# Patient Record
Sex: Male | Born: 1950
Health system: Southern US, Community
[De-identification: ages and names within clinical notes are randomized; demographics above are authoritative.]

## PROBLEM LIST (undated history)

## (undated) DIAGNOSIS — E119 Type 2 diabetes mellitus without complications: Secondary | ICD-10-CM

## (undated) DIAGNOSIS — K760 Fatty (change of) liver, not elsewhere classified: Secondary | ICD-10-CM

## (undated) DIAGNOSIS — E039 Hypothyroidism, unspecified: Secondary | ICD-10-CM

## (undated) HISTORY — DX: Fatty (change of) liver, not elsewhere classified: K76.0

## (undated) HISTORY — PX: OTHER SURGICAL HISTORY: SHX169

## (undated) HISTORY — DX: Type 2 diabetes mellitus without complications: E11.9

## (undated) HISTORY — DX: Hypothyroidism, unspecified: E03.9

---

## 2017-02-21 DIAGNOSIS — I10 Essential (primary) hypertension: Secondary | ICD-10-CM | POA: Diagnosis not present

## 2017-02-21 DIAGNOSIS — Z6833 Body mass index (BMI) 33.0-33.9, adult: Secondary | ICD-10-CM | POA: Diagnosis not present

## 2017-02-21 DIAGNOSIS — E039 Hypothyroidism, unspecified: Secondary | ICD-10-CM | POA: Diagnosis not present

## 2017-02-21 DIAGNOSIS — K7460 Unspecified cirrhosis of liver: Secondary | ICD-10-CM | POA: Diagnosis not present

## 2017-02-21 DIAGNOSIS — Z299 Encounter for prophylactic measures, unspecified: Secondary | ICD-10-CM | POA: Diagnosis not present

## 2017-02-21 DIAGNOSIS — E1165 Type 2 diabetes mellitus with hyperglycemia: Secondary | ICD-10-CM | POA: Diagnosis not present

## 2017-03-14 DIAGNOSIS — Z125 Encounter for screening for malignant neoplasm of prostate: Secondary | ICD-10-CM | POA: Diagnosis not present

## 2017-03-14 DIAGNOSIS — K7460 Unspecified cirrhosis of liver: Secondary | ICD-10-CM | POA: Diagnosis not present

## 2017-03-14 DIAGNOSIS — Z7189 Other specified counseling: Secondary | ICD-10-CM | POA: Diagnosis not present

## 2017-03-14 DIAGNOSIS — E1165 Type 2 diabetes mellitus with hyperglycemia: Secondary | ICD-10-CM | POA: Diagnosis not present

## 2017-03-14 DIAGNOSIS — Z1389 Encounter for screening for other disorder: Secondary | ICD-10-CM | POA: Diagnosis not present

## 2017-03-14 DIAGNOSIS — Z79899 Other long term (current) drug therapy: Secondary | ICD-10-CM | POA: Diagnosis not present

## 2017-03-14 DIAGNOSIS — Z1211 Encounter for screening for malignant neoplasm of colon: Secondary | ICD-10-CM | POA: Diagnosis not present

## 2017-03-14 DIAGNOSIS — Z299 Encounter for prophylactic measures, unspecified: Secondary | ICD-10-CM | POA: Diagnosis not present

## 2017-03-14 DIAGNOSIS — R5383 Other fatigue: Secondary | ICD-10-CM | POA: Diagnosis not present

## 2017-03-14 DIAGNOSIS — I10 Essential (primary) hypertension: Secondary | ICD-10-CM | POA: Diagnosis not present

## 2017-03-14 DIAGNOSIS — Z0000 Encounter for general adult medical examination without abnormal findings: Secondary | ICD-10-CM | POA: Diagnosis not present

## 2017-03-14 DIAGNOSIS — Z6832 Body mass index (BMI) 32.0-32.9, adult: Secondary | ICD-10-CM | POA: Diagnosis not present

## 2017-03-14 DIAGNOSIS — E039 Hypothyroidism, unspecified: Secondary | ICD-10-CM | POA: Diagnosis not present

## 2017-04-27 DIAGNOSIS — H25813 Combined forms of age-related cataract, bilateral: Secondary | ICD-10-CM | POA: Diagnosis not present

## 2017-04-27 DIAGNOSIS — H52223 Regular astigmatism, bilateral: Secondary | ICD-10-CM | POA: Diagnosis not present

## 2017-04-27 DIAGNOSIS — E119 Type 2 diabetes mellitus without complications: Secondary | ICD-10-CM | POA: Diagnosis not present

## 2017-04-27 DIAGNOSIS — H524 Presbyopia: Secondary | ICD-10-CM | POA: Diagnosis not present

## 2017-06-17 DIAGNOSIS — Z299 Encounter for prophylactic measures, unspecified: Secondary | ICD-10-CM | POA: Diagnosis not present

## 2017-06-17 DIAGNOSIS — E1165 Type 2 diabetes mellitus with hyperglycemia: Secondary | ICD-10-CM | POA: Diagnosis not present

## 2017-06-17 DIAGNOSIS — Z6832 Body mass index (BMI) 32.0-32.9, adult: Secondary | ICD-10-CM | POA: Diagnosis not present

## 2017-06-17 DIAGNOSIS — K7460 Unspecified cirrhosis of liver: Secondary | ICD-10-CM | POA: Diagnosis not present

## 2017-06-17 DIAGNOSIS — I10 Essential (primary) hypertension: Secondary | ICD-10-CM | POA: Diagnosis not present

## 2017-06-17 DIAGNOSIS — E039 Hypothyroidism, unspecified: Secondary | ICD-10-CM | POA: Diagnosis not present

## 2017-09-29 DIAGNOSIS — S20212A Contusion of left front wall of thorax, initial encounter: Secondary | ICD-10-CM | POA: Diagnosis not present

## 2017-09-29 DIAGNOSIS — M25522 Pain in left elbow: Secondary | ICD-10-CM | POA: Diagnosis not present

## 2017-09-29 DIAGNOSIS — W010XXA Fall on same level from slipping, tripping and stumbling without subsequent striking against object, initial encounter: Secondary | ICD-10-CM | POA: Diagnosis not present

## 2017-09-29 DIAGNOSIS — R079 Chest pain, unspecified: Secondary | ICD-10-CM | POA: Diagnosis not present

## 2017-09-29 DIAGNOSIS — Z87891 Personal history of nicotine dependence: Secondary | ICD-10-CM | POA: Diagnosis not present

## 2017-09-29 DIAGNOSIS — S299XXA Unspecified injury of thorax, initial encounter: Secondary | ICD-10-CM | POA: Diagnosis not present

## 2017-09-29 DIAGNOSIS — E119 Type 2 diabetes mellitus without complications: Secondary | ICD-10-CM | POA: Diagnosis not present

## 2017-09-29 DIAGNOSIS — S52125A Nondisplaced fracture of head of left radius, initial encounter for closed fracture: Secondary | ICD-10-CM | POA: Diagnosis not present

## 2017-10-03 DIAGNOSIS — Z8379 Family history of other diseases of the digestive system: Secondary | ICD-10-CM | POA: Diagnosis not present

## 2017-10-03 DIAGNOSIS — E039 Hypothyroidism, unspecified: Secondary | ICD-10-CM | POA: Diagnosis not present

## 2017-10-03 DIAGNOSIS — K7460 Unspecified cirrhosis of liver: Secondary | ICD-10-CM | POA: Diagnosis present

## 2017-10-03 DIAGNOSIS — E119 Type 2 diabetes mellitus without complications: Secondary | ICD-10-CM | POA: Diagnosis not present

## 2017-10-03 DIAGNOSIS — T50905A Adverse effect of unspecified drugs, medicaments and biological substances, initial encounter: Secondary | ICD-10-CM | POA: Diagnosis not present

## 2017-10-03 DIAGNOSIS — R410 Disorientation, unspecified: Secondary | ICD-10-CM | POA: Diagnosis not present

## 2017-10-03 DIAGNOSIS — Z87891 Personal history of nicotine dependence: Secondary | ICD-10-CM | POA: Diagnosis not present

## 2017-10-03 DIAGNOSIS — Z8249 Family history of ischemic heart disease and other diseases of the circulatory system: Secondary | ICD-10-CM | POA: Diagnosis not present

## 2017-10-03 DIAGNOSIS — Z7984 Long term (current) use of oral hypoglycemic drugs: Secondary | ICD-10-CM | POA: Diagnosis not present

## 2017-10-03 DIAGNOSIS — K7290 Hepatic failure, unspecified without coma: Secondary | ICD-10-CM | POA: Diagnosis present

## 2017-10-03 DIAGNOSIS — I10 Essential (primary) hypertension: Secondary | ICD-10-CM | POA: Diagnosis present

## 2017-10-03 DIAGNOSIS — Z888 Allergy status to other drugs, medicaments and biological substances status: Secondary | ICD-10-CM | POA: Diagnosis not present

## 2017-10-03 DIAGNOSIS — S42402D Unspecified fracture of lower end of left humerus, subsequent encounter for fracture with routine healing: Secondary | ICD-10-CM | POA: Diagnosis not present

## 2017-10-03 DIAGNOSIS — Z79899 Other long term (current) drug therapy: Secondary | ICD-10-CM | POA: Diagnosis not present

## 2017-10-03 DIAGNOSIS — R4182 Altered mental status, unspecified: Secondary | ICD-10-CM | POA: Diagnosis not present

## 2017-10-06 ENCOUNTER — Encounter (INDEPENDENT_AMBULATORY_CARE_PROVIDER_SITE_OTHER): Payer: Self-pay

## 2017-10-10 ENCOUNTER — Encounter (INDEPENDENT_AMBULATORY_CARE_PROVIDER_SITE_OTHER): Payer: Self-pay | Admitting: *Deleted

## 2017-10-10 ENCOUNTER — Encounter (INDEPENDENT_AMBULATORY_CARE_PROVIDER_SITE_OTHER): Payer: Self-pay | Admitting: Internal Medicine

## 2017-10-10 ENCOUNTER — Ambulatory Visit (INDEPENDENT_AMBULATORY_CARE_PROVIDER_SITE_OTHER): Payer: Medicare Other | Admitting: Internal Medicine

## 2017-10-10 DIAGNOSIS — E039 Hypothyroidism, unspecified: Secondary | ICD-10-CM | POA: Diagnosis not present

## 2017-10-10 DIAGNOSIS — E119 Type 2 diabetes mellitus without complications: Secondary | ICD-10-CM | POA: Insufficient documentation

## 2017-10-10 DIAGNOSIS — K760 Fatty (change of) liver, not elsewhere classified: Secondary | ICD-10-CM | POA: Diagnosis not present

## 2017-10-10 MED ORDER — RIFAXIMIN 550 MG PO TABS: 550.0000 mg | ORAL_TABLET | Freq: Two times a day (BID) | ORAL | 3 refills | Status: DC

## 2017-10-10 NOTE — Patient Instructions (Addendum)
Diet and exercise. Pick Rx for Xifaxin. OV in 3 months.  Low fat diet.  Hold the Lactulose for night till I get the [place] report.  Fatty Liver Fatty liver, also called hepatic steatosis or steatohepatitis, is a condition in which too much fat has built up in your liver cells. The liver removes harmful substances from your bloodstream. It produces fluids your body needs. It also helps your body use and store energy from the food you eat. In many cases, fatty liver does not cause symptoms or problems. It is often diagnosed when tests are being done for other reasons. However, over time, fatty liver can cause inflammation that may lead to more serious liver problems, such as scarring of the liver (cirrhosis). What are the causes? Causes of fatty liver may include:  Drinking too much alcohol.  Poor nutrition.  Obesity.  Cushing syndrome.  Diabetes.  Hyperlipidemia.  Pregnancy.  Certain drugs.  Poisons.  Some viral infections.  What increases the risk? You may be more likely to develop fatty liver if you:  Abuse alcohol.  Are pregnant.  Are overweight.  Have diabetes.  Have hepatitis.  Have a high triglyceride level.  What are the signs or symptoms? Fatty liver often does not cause any symptoms. In cases where symptoms develop, they can include:  Fatigue.  Weakness.  Weight loss.  Confusion.  Abdominal pain.  Yellowing of your skin and the white parts of your eyes (jaundice).  Nausea and vomiting.  How is this diagnosed? Fatty liver may be diagnosed by:  Physical exam and medical history.  Blood tests.  Imaging tests, such as an ultrasound, CT scan, or MRI.  Liver biopsy. A small sample of liver tissue is removed using a needle. The sample is then looked at under a microscope.  How is this treated? Fatty liver is often caused by other health conditions. Treatment for fatty liver may involve medicines and lifestyle changes to manage conditions such  as:  Alcoholism.  High cholesterol.  Diabetes.  Being overweight or obese.  Follow these instructions at home:  Eat a healthy diet as directed by your health care provider.  Exercise regularly. This can help you lose weight and control your cholesterol and diabetes. Talk to your health care provider about an exercise plan and which activities are best for you.  Do not drink alcohol.  Take medicines only as directed by your health care provider. Contact a health care provider if: You have difficulty controlling your:  Blood sugar.  Cholesterol.  Alcohol consumption.  Get help right away if:  You have abdominal pain.  You have jaundice.  You have nausea and vomiting. This information is not intended to replace advice given to you by your health care provider. Make sure you discuss any questions you have with your health care provider. Document Released: 10/22/2005 Document Revised: 02/12/2016 Document Reviewed: 01/16/2014 Elsevier Interactive Patient Education  [employer].

## 2017-10-10 NOTE — Progress Notes (Addendum)
Subjective:    Patient ID: Jeremy Chavez, male    DOB: December 13, 1950, 67 y.o.   MRN: [id number]  HPI Referred by Dr. Woody Seller for hepatic encephalopathy/cirrhosis. Admitted.  [place] for mental status changes in January of this year.  Noted to have ammonia of 108. Discharge diagnosis of hepatic encephalopathy  due to cirrhoisis of the liver. Admitted x 3 days. Wife is in room.   Hx of diabetes, cirrhosis, hypothyroidism, hypertension, aortic valve sclerosis.  Wife states patient was seen in [place]. Was told he had cirrhosis at that time.  Family hx of fatty liver in a sister who is now deceased at age 48 Appetite is good. He usually has a BM x 1 a day before the Lactulose. No melena or BRRB. Evaluated at [facility] in [facility] and was told he had cirrhosis in 2015     10/07/2014 EGD/colonoscopy: The duodenum revealed some duodenitis with erythematous patches. Stomach itself revealed antral gastritis with erosions. Biopsies were taken and will be checked for Helicobacter. There was mild reflux esophagitis at the EG junction. Again, biopsies were taken here and will be checked for Barrett's. He had some minimal dilatation of some esophageal veins. Pediatric colonoscope was introduced into the rectum and manipulated up to the cecum. Bowel prep was not good. He had diverticulosis of the left colon. He had some prominent erythematous blushy type vessels in the cecum.   02/26/2015 US abdomen: Splenomegaly. Minimally complex left renal cyst.        10/03/2016 Chest xray. One week of acute mental status changes.  No acute cardiopulmonary disease. Stable mild changes of bronchitis and/or asthma which may be acute or chronic.  10/03/2017 CT  HEAD: No acute intracranial pathology. 10/04/2017 ammonia 157.2 10/03/2017 Plasma ammonia found to be elevated at 108.  10/03/2016 ammonia 62. 10/03/2017 Acute hepatitis panel was negative. 10/03/2017 H and H 13 and 33.3. Platelet ct 64.  ALP 80, AST 36.3,  total bili 1.8, ALT 22., Lipase 93.  10/03/2017:Urinalysis showed 0-5 WBCs, Drug screen negative.   Review of Systems Past Medical History:  Diagnosis Date  . Diabetes ([place])   . Hypothyroid   . NAFLD (nonalcoholic fatty liver disease)       No Known Allergies  Current Outpatient Medications on File Prior to Visit  Medication Sig Dispense Refill  . Ferrous Gluconate (IRON 27 PO) Take 65 mg by mouth.    . lactulose (CHRONULAC) 10 GM/15ML solution Take by mouth 3 (three) times daily.    Marland Kitchen levothyroxine (SYNTHROID, LEVOTHROID) 125 MCG tablet Take 125 mcg by mouth daily before breakfast.    . lisinopril-hydrochlorothiazide (PRINZIDE,ZESTORETIC) 20-25 MG tablet Take 1 tablet by mouth daily.    . meloxicam (MOBIC) 15 MG tablet Take 15 mg by mouth daily.     No current facility-administered medications on file prior to visit.         Objective:   Physical Exam Blood pressure (!) 146/72, pulse 72, temperature 98 F (36.7 C), height 6' 1"  (1.854 m), weight 260 lb 9.6 oz (118.2 kg). Alert and oriented. Skin warm and dry. Oral mucosa is moist.   . Sclera anicteric, conjunctivae is pink. Thyroid not enlarged. No cervical lymphadenopathy. Lungs clear. Heart regular rate and rhythm.  Abdomen is soft. Bowel sounds are positive. No hepatomegaly. No abdominal masses felt. No tenderness.  No edema to lower extremities.          Assessment & Plan:  Probably NAFLD.Recent hx of hepatic encephalopathy. Will get  US abdomen and Labs. (Insurance would not pay for [employer]) Rx for Xifaxan sent to his pharmacy. Not to start the xifaxan till I get [place] report.  Thrombocytopenia probably related to his NAFLD.  Family hx of NAFLD in a sister who died at age 72.  OV in 3 months.

## 2017-10-11 LAB — HEPATIC FUNCTION PANEL
AG RATIO: 1.2 (calc) (ref 1.0–2.5)
ALBUMIN MSPROF: 3.7 g/dL (ref 3.6–5.1)
ALT: 26 U/L (ref 9–46)
AST: 38 U/L — ABNORMAL HIGH (ref 10–35)
Alkaline phosphatase (APISO): 88 U/L (ref 40–115)
Bilirubin, Direct: 0.4 mg/dL — ABNORMAL HIGH (ref 0.0–0.2)
GLOBULIN: 3 g/dL (ref 1.9–3.7)
Indirect Bilirubin: 1.4 mg/dL (calc) — ABNORMAL HIGH (ref 0.2–1.2)
TOTAL PROTEIN: 6.7 g/dL (ref 6.1–8.1)
Total Bilirubin: 1.8 mg/dL — ABNORMAL HIGH (ref 0.2–1.2)

## 2017-10-11 LAB — ALPHA-1-ANTITRYPSIN: A-1 Antitrypsin, Ser: 170 mg/dL (ref 83–199)

## 2017-10-11 LAB — PROTIME-INR
INR: 1.1
PROTHROMBIN TIME: 11.2 s (ref 9.0–11.5)

## 2017-10-11 LAB — AMMONIA: AMMONIA: 128 umol/L — AB (ref <=72)

## 2017-10-11 LAB — SEDIMENTATION RATE: SED RATE: 11 mm/h (ref 0–20)

## 2017-10-11 LAB — CERULOPLASMIN: Ceruloplasmin: 27 mg/dL (ref 18–36)

## 2017-10-12 ENCOUNTER — Other Ambulatory Visit (INDEPENDENT_AMBULATORY_CARE_PROVIDER_SITE_OTHER): Payer: Self-pay | Admitting: *Deleted

## 2017-10-12 ENCOUNTER — Encounter (INDEPENDENT_AMBULATORY_CARE_PROVIDER_SITE_OTHER): Payer: Self-pay | Admitting: *Deleted

## 2017-10-12 DIAGNOSIS — I10 Essential (primary) hypertension: Secondary | ICD-10-CM | POA: Diagnosis not present

## 2017-10-12 DIAGNOSIS — Z299 Encounter for prophylactic measures, unspecified: Secondary | ICD-10-CM | POA: Diagnosis not present

## 2017-10-12 DIAGNOSIS — E1165 Type 2 diabetes mellitus with hyperglycemia: Secondary | ICD-10-CM | POA: Diagnosis not present

## 2017-10-12 DIAGNOSIS — Z87891 Personal history of nicotine dependence: Secondary | ICD-10-CM | POA: Diagnosis not present

## 2017-10-12 DIAGNOSIS — E039 Hypothyroidism, unspecified: Secondary | ICD-10-CM | POA: Diagnosis not present

## 2017-10-12 DIAGNOSIS — Z6833 Body mass index (BMI) 33.0-33.9, adult: Secondary | ICD-10-CM | POA: Diagnosis not present

## 2017-10-12 DIAGNOSIS — Z09 Encounter for follow-up examination after completed treatment for conditions other than malignant neoplasm: Secondary | ICD-10-CM | POA: Diagnosis not present

## 2017-10-12 DIAGNOSIS — K760 Fatty (change of) liver, not elsewhere classified: Secondary | ICD-10-CM

## 2017-10-12 DIAGNOSIS — K7290 Hepatic failure, unspecified without coma: Secondary | ICD-10-CM | POA: Diagnosis not present

## 2017-10-12 DIAGNOSIS — K7460 Unspecified cirrhosis of liver: Secondary | ICD-10-CM | POA: Diagnosis not present

## 2017-10-14 ENCOUNTER — Ambulatory Visit (HOSPITAL_COMMUNITY)
Admission: RE | Admit: 2017-10-14 | Discharge: 2017-10-14 | Disposition: A | Discharge status: Home / Self Care | Payer: Medicare Other | Source: Ambulatory Visit | Attending: Internal Medicine | Admitting: Internal Medicine

## 2017-10-14 DIAGNOSIS — K829 Disease of gallbladder, unspecified: Secondary | ICD-10-CM | POA: Diagnosis not present

## 2017-10-14 DIAGNOSIS — Q619 Cystic kidney disease, unspecified: Secondary | ICD-10-CM | POA: Insufficient documentation

## 2017-10-14 DIAGNOSIS — R161 Splenomegaly, not elsewhere classified: Secondary | ICD-10-CM | POA: Diagnosis not present

## 2017-10-14 DIAGNOSIS — K760 Fatty (change of) liver, not elsewhere classified: Secondary | ICD-10-CM | POA: Diagnosis not present

## 2017-10-18 ENCOUNTER — Telehealth (INDEPENDENT_AMBULATORY_CARE_PROVIDER_SITE_OTHER): Payer: Self-pay | Admitting: *Deleted

## 2017-10-18 NOTE — Telephone Encounter (Signed)
Aunya called from [place] my meds to check on the piror authorization for xisaxan. Please advise [phone] ref # [id number]

## 2017-10-18 NOTE — Telephone Encounter (Signed)
I made Jeremy Chavez aware

## 2017-10-18 NOTE — Telephone Encounter (Signed)
Let her know the Xifaxan is on hold right now till I get his [place]

## 2017-10-19 ENCOUNTER — Telehealth (INDEPENDENT_AMBULATORY_CARE_PROVIDER_SITE_OTHER): Payer: Self-pay | Admitting: Internal Medicine

## 2017-10-19 DIAGNOSIS — K760 Fatty (change of) liver, not elsewhere classified: Secondary | ICD-10-CM | POA: Diagnosis not present

## 2017-10-19 DIAGNOSIS — N2889 Other specified disorders of kidney and ureter: Secondary | ICD-10-CM

## 2017-10-19 NOTE — Telephone Encounter (Signed)
MRI sch'd 10/28/17, patient aware

## 2017-10-19 NOTE — Telephone Encounter (Signed)
MRI ordered

## 2017-10-20 LAB — AMMONIA: AMMONIA: 95 umol/L — AB (ref <=72)

## 2017-10-28 ENCOUNTER — Ambulatory Visit (HOSPITAL_COMMUNITY)
Admission: RE | Admit: 2017-10-28 | Discharge: 2017-10-28 | Disposition: A | Discharge status: Home / Self Care | Payer: Medicare Other | Source: Ambulatory Visit | Attending: Internal Medicine | Admitting: Internal Medicine

## 2017-10-28 DIAGNOSIS — I864 Gastric varices: Secondary | ICD-10-CM | POA: Diagnosis not present

## 2017-10-28 DIAGNOSIS — N281 Cyst of kidney, acquired: Secondary | ICD-10-CM | POA: Insufficient documentation

## 2017-10-28 DIAGNOSIS — N2889 Other specified disorders of kidney and ureter: Secondary | ICD-10-CM

## 2017-10-28 DIAGNOSIS — K760 Fatty (change of) liver, not elsewhere classified: Secondary | ICD-10-CM | POA: Diagnosis not present

## 2017-10-28 DIAGNOSIS — I8500 Esophageal varices without bleeding: Secondary | ICD-10-CM | POA: Insufficient documentation

## 2017-10-28 DIAGNOSIS — K7460 Unspecified cirrhosis of liver: Secondary | ICD-10-CM | POA: Diagnosis not present

## 2017-10-28 DIAGNOSIS — R161 Splenomegaly, not elsewhere classified: Secondary | ICD-10-CM | POA: Insufficient documentation

## 2017-10-28 LAB — POCT I-STAT CREATININE: CREATININE: 0.9 mg/dL (ref 0.61–1.24)

## 2017-10-28 MED ORDER — GADOBENATE DIMEGLUMINE 529 MG/ML IV SOLN
20.0000 mL | Freq: Once | INTRAVENOUS | Status: AC | PRN
  Administered 2017-10-28: 20 mL via INTRAVENOUS

## 2017-10-31 ENCOUNTER — Telehealth (INDEPENDENT_AMBULATORY_CARE_PROVIDER_SITE_OTHER): Payer: Self-pay | Admitting: *Deleted

## 2017-10-31 NOTE — Telephone Encounter (Signed)
Results of MRI given to wife.

## 2017-10-31 NOTE — Telephone Encounter (Signed)
Patient's wife left message returning Terri's call. [phone]

## 2017-11-11 DIAGNOSIS — E1165 Type 2 diabetes mellitus with hyperglycemia: Secondary | ICD-10-CM | POA: Diagnosis not present

## 2017-11-11 DIAGNOSIS — K7290 Hepatic failure, unspecified without coma: Secondary | ICD-10-CM | POA: Diagnosis not present

## 2017-11-11 DIAGNOSIS — Z6831 Body mass index (BMI) 31.0-31.9, adult: Secondary | ICD-10-CM | POA: Diagnosis not present

## 2017-11-11 DIAGNOSIS — K7460 Unspecified cirrhosis of liver: Secondary | ICD-10-CM | POA: Diagnosis not present

## 2017-11-11 DIAGNOSIS — Z299 Encounter for prophylactic measures, unspecified: Secondary | ICD-10-CM | POA: Diagnosis not present

## 2017-11-17 ENCOUNTER — Telehealth (INDEPENDENT_AMBULATORY_CARE_PROVIDER_SITE_OTHER): Payer: Self-pay | Admitting: Internal Medicine

## 2017-11-17 NOTE — Telephone Encounter (Signed)
He is aware of his ammonia. He had ran out of his Lactulose. Did not get Xifaxan filled. The co pay was too high. Has restarted the lactulose 30cc TID.     Jeremy Chavez recheck ammonia in 4 weeks.

## 2017-11-18 ENCOUNTER — Other Ambulatory Visit (INDEPENDENT_AMBULATORY_CARE_PROVIDER_SITE_OTHER): Payer: Self-pay | Admitting: *Deleted

## 2017-11-18 ENCOUNTER — Encounter (INDEPENDENT_AMBULATORY_CARE_PROVIDER_SITE_OTHER): Payer: Self-pay | Admitting: *Deleted

## 2017-11-18 DIAGNOSIS — K760 Fatty (change of) liver, not elsewhere classified: Secondary | ICD-10-CM

## 2017-11-18 NOTE — Telephone Encounter (Signed)
Lab noted for 3 weeks and a letter has been sent to the patient.

## 2017-12-16 DIAGNOSIS — K760 Fatty (change of) liver, not elsewhere classified: Secondary | ICD-10-CM | POA: Diagnosis not present

## 2017-12-17 LAB — AMMONIA: Ammonia: 102 umol/L — ABNORMAL HIGH (ref <=72)

## 2017-12-20 ENCOUNTER — Other Ambulatory Visit (INDEPENDENT_AMBULATORY_CARE_PROVIDER_SITE_OTHER): Payer: Self-pay | Admitting: *Deleted

## 2017-12-20 ENCOUNTER — Encounter (INDEPENDENT_AMBULATORY_CARE_PROVIDER_SITE_OTHER): Payer: Self-pay | Admitting: *Deleted

## 2017-12-20 DIAGNOSIS — K760 Fatty (change of) liver, not elsewhere classified: Secondary | ICD-10-CM

## 2017-12-27 ENCOUNTER — Ambulatory Visit (INDEPENDENT_AMBULATORY_CARE_PROVIDER_SITE_OTHER): Payer: Medicare Other | Admitting: Urology

## 2017-12-27 DIAGNOSIS — N281 Cyst of kidney, acquired: Secondary | ICD-10-CM | POA: Diagnosis not present

## 2017-12-27 DIAGNOSIS — N5201 Erectile dysfunction due to arterial insufficiency: Secondary | ICD-10-CM

## 2018-01-03 DIAGNOSIS — K760 Fatty (change of) liver, not elsewhere classified: Secondary | ICD-10-CM | POA: Diagnosis not present

## 2018-01-04 ENCOUNTER — Telehealth (INDEPENDENT_AMBULATORY_CARE_PROVIDER_SITE_OTHER): Payer: Self-pay | Admitting: Internal Medicine

## 2018-01-04 LAB — AMMONIA: AMMONIA: 116 umol/L — AB (ref <=72)

## 2018-01-04 NOTE — Telephone Encounter (Signed)
Results given to patient.  Will repeat ammonia in 2 weeks.  Denies any confusion. Having 2-3 stools a day and somedays he has one stool. Taking 3 times a day.   Tammy, Ammonia in 2 weeks.

## 2018-01-05 ENCOUNTER — Other Ambulatory Visit (INDEPENDENT_AMBULATORY_CARE_PROVIDER_SITE_OTHER): Payer: Self-pay | Admitting: *Deleted

## 2018-01-05 DIAGNOSIS — K760 Fatty (change of) liver, not elsewhere classified: Secondary | ICD-10-CM

## 2018-01-05 NOTE — Telephone Encounter (Signed)
The ammonia level has been ordered.

## 2018-01-09 ENCOUNTER — Ambulatory Visit (INDEPENDENT_AMBULATORY_CARE_PROVIDER_SITE_OTHER): Payer: PRIVATE HEALTH INSURANCE | Admitting: Internal Medicine

## 2018-01-11 ENCOUNTER — Encounter (INDEPENDENT_AMBULATORY_CARE_PROVIDER_SITE_OTHER): Payer: Self-pay | Admitting: *Deleted

## 2018-01-11 ENCOUNTER — Encounter (INDEPENDENT_AMBULATORY_CARE_PROVIDER_SITE_OTHER): Payer: Self-pay | Admitting: Internal Medicine

## 2018-01-11 ENCOUNTER — Ambulatory Visit (INDEPENDENT_AMBULATORY_CARE_PROVIDER_SITE_OTHER): Payer: Medicare Other | Admitting: Internal Medicine

## 2018-01-11 VITALS — BP 128/58 | HR 60 | Temp 97.8°F | Ht 73.0 in | Wt 240.3 lb

## 2018-01-11 DIAGNOSIS — K7460 Unspecified cirrhosis of liver: Secondary | ICD-10-CM | POA: Diagnosis not present

## 2018-01-11 DIAGNOSIS — D696 Thrombocytopenia, unspecified: Secondary | ICD-10-CM

## 2018-01-11 LAB — SEDIMENTATION RATE: SED RATE: 6 mm/h (ref 0–20)

## 2018-01-11 NOTE — Progress Notes (Signed)
Subjective:    Patient ID: Jeremy Chavez, male    DOB: Jun 08, 1951, 67 y.o.   MRN: [id number]  HPI Here today for f/o. Last seen in January of this year. Hx of NAFLD/cirrhosis. Admitted to [facility] in January of this for mental status changes. Noted to have ammonia 108.  Admitted x 3 days.  10/04/2017 Ammonia 157. He tells me he is doing good. He good not afford the Xifaxan. He has lost about 20 pounds since January which was intentional. He has been eating more healthier.  He has a BM x 2-3 a day. Stools are soft.  No melena or BRRB.  Blood sugars 104-125 HA1C 2 months ago 5.3.    Hx of diabetes, cirrhosis, hypothyroidism, hypertension Evaluated at [facility] in [facility] was was told he had cirrhosis in 2015 Family hx of fatty liver in a sister who is now deceased at age 27.    4/162019 Ammonia 118 10/10/2017 Ceruloplasmin, Alpha 1 antitrypsin normal. INR/PT 1.1/11.2. Acute hepatitis panel negative.   10/05/2017 H and H 10.5 and 30.4, Platelet ct 61.         10/28/2017 MR Abdomen/w/o CM:   . IMPRESSION: Bilateral renal cysts, including a 4.0 cm bilobed hemorrhagic cyst arising from the posterior left upper kidney, benign (Bosniak II). Two additional hemorrhagic cysts in the interpolar and left lower kidney measuring up to 1.4 cm, benign (Bosniak II).  No enhancing renal lesions.  Cirrhosis.  No suspicious hepatic lesions.  Splenomegaly.  Gastroesophageal varices.  Splenorenal shunt.  Apparent partial filling defect in the main portal vein, worrisome for subocclusive portal vein thrombosis.   10/14/2017 US abdomen;  Liver: Mild increased echogenicity is noted consistent with the given clinical history of fatty infiltration. Portal vein is patent on color Doppler imaging with normal direction of blood flow towards the liver.  IMPRESSION: Mild splenomegaly and suggestion of splenic varices.  Gallbladder wall thickening without  cholelithiasis.     10/07/2014 EGD/colonoscopy: guaiac positive stool, anemia, heartburn.  The duodenum revealed some duodenitis with erythematous patches. Stomach itself revealed antral gastritis with erosions. Biopsies were taken and will be checked for Helicobacter. There was mild reflux esophagitis at the EG junction. Again, biopsies were taken here and will be checked for Barrett's. He had some minimal dilatation of some esophageal veins. Pediatric colonoscope was introduced into the rectum and manipulated up to the cecum. Bowel prep was not good. He had diverticulosis of the left colon. He had some prominent erythematous blushy type vessels in the cecum.   02/26/2015 US abdomen: Splenomegaly. Minimally complex left renal cyst.     Review of Systems Past Medical History:  Diagnosis Date  . Diabetes ([place])   . Hypothyroid   . NAFLD (nonalcoholic fatty liver disease)     Past Surgical History:  Procedure Laterality Date  . carpal tunnel rt hand    . umblical hernia repair      No Known Allergies  Current Outpatient Medications on File Prior to Visit  Medication Sig Dispense Refill  . Ferrous Gluconate (IRON 27 PO) Take 325 mg by mouth 2 (two) times daily before a meal.     . lactulose (CHRONULAC) 10 GM/15ML solution Take by mouth 3 (three) times daily.    Marland Kitchen levothyroxine (SYNTHROID, LEVOTHROID) 125 MCG tablet Take 125 mcg by mouth daily before breakfast.    . lisinopril-hydrochlorothiazide (PRINZIDE,ZESTORETIC) 20-25 MG tablet Take 1 tablet by mouth daily.     No current facility-administered medications on file prior to  visit.         Objective:   Physical Exam Blood pressure (!) 128/58, pulse 60, temperature 97.8 F (36.6 C), height 6\' 1"  (1.854 m), weight 240 lb 4.8 oz (109 kg). Alert and oriented. Skin warm and dry. Oral mucosa is moist.   . Sclera anicteric, conjunctivae is pink. Thyroid not enlarged. No cervical lymphadenopathy. Lungs clear. Heart regular rate and  rhythm.  Abdomen is soft. Bowel sounds are positive. No hepatomegaly. No abdominal masses felt. No tenderness. 1+ edema to lower extremities.           Assessment & Plan:  NAFLD/Cirrhosis: Will get SMA, ANA, IGG, sedrate, CBC, Hepatic  Esophageal Varices needs to be ruled out. EGD with possible banding. Thrombocytopenia probably related to his NAFLD/cirrhosis.  OV in 6 months.

## 2018-01-11 NOTE — Patient Instructions (Addendum)
Labs. EGD/with possible banding. The risks of bleeding, perforation and infection were reviewed with patient.

## 2018-01-12 ENCOUNTER — Encounter (INDEPENDENT_AMBULATORY_CARE_PROVIDER_SITE_OTHER): Payer: Self-pay | Admitting: *Deleted

## 2018-01-12 ENCOUNTER — Other Ambulatory Visit (INDEPENDENT_AMBULATORY_CARE_PROVIDER_SITE_OTHER): Payer: Self-pay | Admitting: *Deleted

## 2018-01-12 DIAGNOSIS — K760 Fatty (change of) liver, not elsewhere classified: Secondary | ICD-10-CM

## 2018-01-12 LAB — CBC WITH DIFFERENTIAL/PLATELET
BASOS PCT: 0.6 %
Basophils Absolute: 19 cells/uL (ref 0–200)
Eosinophils Absolute: 61 cells/uL (ref 15–500)
Eosinophils Relative: 1.9 %
HEMATOCRIT: 29.5 % — AB (ref 38.5–50.0)
HEMOGLOBIN: 10.4 g/dL — AB (ref 13.2–17.1)
LYMPHS ABS: 880 {cells}/uL (ref 850–3900)
MCH: 31.5 pg (ref 27.0–33.0)
MCHC: 35.3 g/dL (ref 32.0–36.0)
MCV: 89.4 fL (ref 80.0–100.0)
MPV: 11.7 fL (ref 7.5–12.5)
Monocytes Relative: 10.4 %
Neutro Abs: 1907 cells/uL (ref 1500–7800)
Neutrophils Relative %: 59.6 %
Platelets: 50 10*3/uL — ABNORMAL LOW (ref 140–400)
RBC: 3.3 10*6/uL — AB (ref 4.20–5.80)
RDW: 13.2 % (ref 11.0–15.0)
TOTAL LYMPHOCYTE: 27.5 %
WBC: 3.2 10*3/uL — ABNORMAL LOW (ref 3.8–10.8)
WBCMIX: 333 {cells}/uL (ref 200–950)

## 2018-01-12 LAB — HEPATIC FUNCTION PANEL
AG RATIO: 1.5 (calc) (ref 1.0–2.5)
ALBUMIN MSPROF: 3.8 g/dL (ref 3.6–5.1)
ALT: 19 U/L (ref 9–46)
AST: 33 U/L (ref 10–35)
Alkaline phosphatase (APISO): 61 U/L (ref 40–115)
BILIRUBIN INDIRECT: 2.2 mg/dL — AB (ref 0.2–1.2)
Bilirubin, Direct: 0.6 mg/dL — ABNORMAL HIGH (ref 0.0–0.2)
GLOBULIN: 2.5 g/dL (ref 1.9–3.7)
TOTAL PROTEIN: 6.3 g/dL (ref 6.1–8.1)
Total Bilirubin: 2.8 mg/dL — ABNORMAL HIGH (ref 0.2–1.2)

## 2018-01-12 LAB — IGG: IgG (Immunoglobin G), Serum: 1277 mg/dL (ref 694–1618)

## 2018-01-13 LAB — ANA: Anti Nuclear Antibody(ANA): NEGATIVE

## 2018-01-16 ENCOUNTER — Encounter (INDEPENDENT_AMBULATORY_CARE_PROVIDER_SITE_OTHER): Payer: Self-pay | Admitting: *Deleted

## 2018-01-16 ENCOUNTER — Other Ambulatory Visit (INDEPENDENT_AMBULATORY_CARE_PROVIDER_SITE_OTHER): Payer: Self-pay | Admitting: *Deleted

## 2018-01-16 DIAGNOSIS — K7460 Unspecified cirrhosis of liver: Secondary | ICD-10-CM

## 2018-01-17 LAB — ANTI-SMOOTH MUSCLE ANTIBODY, IGG: Actin (Smooth Muscle) Antibody (IGG): <20 U (ref <20)

## 2018-01-20 DIAGNOSIS — K7460 Unspecified cirrhosis of liver: Secondary | ICD-10-CM | POA: Diagnosis not present

## 2018-01-20 DIAGNOSIS — K760 Fatty (change of) liver, not elsewhere classified: Secondary | ICD-10-CM | POA: Diagnosis not present

## 2018-01-20 LAB — CBC
HCT: 28.9 % — ABNORMAL LOW (ref 38.5–50.0)
HEMOGLOBIN: 10.2 g/dL — AB (ref 13.2–17.1)
MCH: 31.5 pg (ref 27.0–33.0)
MCHC: 35.3 g/dL (ref 32.0–36.0)
MCV: 89.2 fL (ref 80.0–100.0)
MPV: 12.5 fL (ref 7.5–12.5)
Platelets: 52 10*3/uL — ABNORMAL LOW (ref 140–400)
RBC: 3.24 10*6/uL — ABNORMAL LOW (ref 4.20–5.80)
RDW: 13.2 % (ref 11.0–15.0)
WBC: 2.8 10*3/uL — ABNORMAL LOW (ref 3.8–10.8)

## 2018-01-20 LAB — HEPATIC FUNCTION PANEL
AG Ratio: 1.4 (calc) (ref 1.0–2.5)
ALBUMIN MSPROF: 3.8 g/dL (ref 3.6–5.1)
ALT: 21 U/L (ref 9–46)
AST: 36 U/L — AB (ref 10–35)
Alkaline phosphatase (APISO): 70 U/L (ref 40–115)
BILIRUBIN DIRECT: 0.5 mg/dL — AB (ref 0.0–0.2)
BILIRUBIN TOTAL: 2 mg/dL — AB (ref 0.2–1.2)
Globulin: 2.7 g/dL (calc) (ref 1.9–3.7)
Indirect Bilirubin: 1.5 mg/dL (calc) — ABNORMAL HIGH (ref 0.2–1.2)
Total Protein: 6.5 g/dL (ref 6.1–8.1)

## 2018-01-21 LAB — AMMONIA: AMMONIA: 87 umol/L — AB (ref <=72)

## 2018-01-23 ENCOUNTER — Other Ambulatory Visit (INDEPENDENT_AMBULATORY_CARE_PROVIDER_SITE_OTHER): Payer: Self-pay | Admitting: *Deleted

## 2018-01-23 DIAGNOSIS — K7030 Alcoholic cirrhosis of liver without ascites: Secondary | ICD-10-CM

## 2018-02-06 ENCOUNTER — Encounter (INDEPENDENT_AMBULATORY_CARE_PROVIDER_SITE_OTHER): Payer: Self-pay | Admitting: *Deleted

## 2018-02-06 ENCOUNTER — Other Ambulatory Visit (INDEPENDENT_AMBULATORY_CARE_PROVIDER_SITE_OTHER): Payer: Self-pay | Admitting: *Deleted

## 2018-02-06 DIAGNOSIS — K7030 Alcoholic cirrhosis of liver without ascites: Secondary | ICD-10-CM

## 2018-02-07 DIAGNOSIS — Z7984 Long term (current) use of oral hypoglycemic drugs: Secondary | ICD-10-CM | POA: Diagnosis not present

## 2018-02-07 DIAGNOSIS — W19XXXA Unspecified fall, initial encounter: Secondary | ICD-10-CM | POA: Diagnosis not present

## 2018-02-07 DIAGNOSIS — S40212A Abrasion of left shoulder, initial encounter: Secondary | ICD-10-CM | POA: Diagnosis not present

## 2018-02-07 DIAGNOSIS — Z87891 Personal history of nicotine dependence: Secondary | ICD-10-CM | POA: Diagnosis not present

## 2018-02-07 DIAGNOSIS — E119 Type 2 diabetes mellitus without complications: Secondary | ICD-10-CM | POA: Diagnosis not present

## 2018-02-07 DIAGNOSIS — M25512 Pain in left shoulder: Secondary | ICD-10-CM | POA: Diagnosis not present

## 2018-02-07 DIAGNOSIS — S4992XA Unspecified injury of left shoulder and upper arm, initial encounter: Secondary | ICD-10-CM | POA: Diagnosis not present

## 2018-02-10 ENCOUNTER — Encounter (HOSPITAL_COMMUNITY)
Admission: RE | Disposition: A | Discharge status: Home / Self Care | Payer: Self-pay | Source: Ambulatory Visit | Attending: Internal Medicine

## 2018-02-10 ENCOUNTER — Other Ambulatory Visit: Payer: Self-pay

## 2018-02-10 ENCOUNTER — Ambulatory Visit (HOSPITAL_COMMUNITY)
Admission: RE | Admit: 2018-02-10 | Discharge: 2018-02-10 | Disposition: A | Discharge status: Home / Self Care | Payer: Medicare Other | Source: Ambulatory Visit | Attending: Internal Medicine | Admitting: Internal Medicine

## 2018-02-10 ENCOUNTER — Encounter (HOSPITAL_COMMUNITY): Payer: Self-pay | Admitting: *Deleted

## 2018-02-10 DIAGNOSIS — K3189 Other diseases of stomach and duodenum: Secondary | ICD-10-CM | POA: Insufficient documentation

## 2018-02-10 DIAGNOSIS — K259 Gastric ulcer, unspecified as acute or chronic, without hemorrhage or perforation: Secondary | ICD-10-CM | POA: Insufficient documentation

## 2018-02-10 DIAGNOSIS — E119 Type 2 diabetes mellitus without complications: Secondary | ICD-10-CM | POA: Diagnosis not present

## 2018-02-10 DIAGNOSIS — Z800 Family history of malignant neoplasm of digestive organs: Secondary | ICD-10-CM | POA: Diagnosis not present

## 2018-02-10 DIAGNOSIS — K7290 Hepatic failure, unspecified without coma: Secondary | ICD-10-CM | POA: Insufficient documentation

## 2018-02-10 DIAGNOSIS — Z87891 Personal history of nicotine dependence: Secondary | ICD-10-CM | POA: Diagnosis not present

## 2018-02-10 DIAGNOSIS — R161 Splenomegaly, not elsewhere classified: Secondary | ICD-10-CM | POA: Diagnosis not present

## 2018-02-10 DIAGNOSIS — K269 Duodenal ulcer, unspecified as acute or chronic, without hemorrhage or perforation: Secondary | ICD-10-CM | POA: Diagnosis not present

## 2018-02-10 DIAGNOSIS — K766 Portal hypertension: Secondary | ICD-10-CM | POA: Diagnosis not present

## 2018-02-10 DIAGNOSIS — I8510 Secondary esophageal varices without bleeding: Secondary | ICD-10-CM | POA: Diagnosis not present

## 2018-02-10 DIAGNOSIS — Z7984 Long term (current) use of oral hypoglycemic drugs: Secondary | ICD-10-CM | POA: Diagnosis not present

## 2018-02-10 DIAGNOSIS — E039 Hypothyroidism, unspecified: Secondary | ICD-10-CM | POA: Insufficient documentation

## 2018-02-10 DIAGNOSIS — K253 Acute gastric ulcer without hemorrhage or perforation: Secondary | ICD-10-CM

## 2018-02-10 DIAGNOSIS — K760 Fatty (change of) liver, not elsewhere classified: Secondary | ICD-10-CM | POA: Insufficient documentation

## 2018-02-10 DIAGNOSIS — Z79899 Other long term (current) drug therapy: Secondary | ICD-10-CM | POA: Diagnosis not present

## 2018-02-10 DIAGNOSIS — I8500 Esophageal varices without bleeding: Secondary | ICD-10-CM | POA: Diagnosis not present

## 2018-02-10 DIAGNOSIS — K7460 Unspecified cirrhosis of liver: Secondary | ICD-10-CM | POA: Insufficient documentation

## 2018-02-10 DIAGNOSIS — K7030 Alcoholic cirrhosis of liver without ascites: Secondary | ICD-10-CM | POA: Diagnosis not present

## 2018-02-10 DIAGNOSIS — K228 Other specified diseases of esophagus: Secondary | ICD-10-CM | POA: Diagnosis not present

## 2018-02-10 HISTORY — PX: ESOPHAGOGASTRODUODENOSCOPY: SHX5428

## 2018-02-10 LAB — GLUCOSE, CAPILLARY: GLUCOSE-CAPILLARY: 87 mg/dL (ref 65–99)

## 2018-02-10 SURGERY — EGD (ESOPHAGOGASTRODUODENOSCOPY)
Anesthesia: Moderate Sedation

## 2018-02-10 MED ORDER — MIDAZOLAM HCL 5 MG/5ML IJ SOLN
INTRAMUSCULAR | Status: DC | PRN
  Administered 2018-02-10: 1 mg via INTRAVENOUS
  Administered 2018-02-10 (×2): 2 mg via INTRAVENOUS
  Administered 2018-02-10: 1 mg via INTRAVENOUS

## 2018-02-10 MED ORDER — MEPERIDINE HCL 50 MG/ML IJ SOLN
INTRAMUSCULAR | Status: DC | PRN
  Administered 2018-02-10 (×2): 25 mg via INTRAVENOUS

## 2018-02-10 MED ORDER — MIDAZOLAM HCL 5 MG/5ML IJ SOLN
INTRAMUSCULAR | Status: AC
  Filled 2018-02-10: qty 10

## 2018-02-10 MED ORDER — PANTOPRAZOLE SODIUM 40 MG PO TBEC: 40.0000 mg | DELAYED_RELEASE_TABLET | Freq: Every day | ORAL | 5 refills | Status: DC

## 2018-02-10 MED ORDER — LIDOCAINE VISCOUS HCL 2 % MT SOLN
OROMUCOSAL | Status: DC | PRN
  Administered 2018-02-10: 1 via OROMUCOSAL

## 2018-02-10 MED ORDER — LIDOCAINE VISCOUS HCL 2 % MT SOLN
OROMUCOSAL | Status: DC
Start: 2018-02-10 — End: 2018-02-10
  Filled 2018-02-10: qty 15

## 2018-02-10 MED ORDER — MEPERIDINE HCL 50 MG/ML IJ SOLN
INTRAMUSCULAR | Status: AC
  Filled 2018-02-10: qty 1

## 2018-02-10 MED ORDER — SODIUM CHLORIDE 0.9 % IV SOLN
INTRAVENOUS | Status: DC
  Administered 2018-02-10: 1000 mL via INTRAVENOUS

## 2018-02-10 MED ORDER — STERILE WATER FOR IRRIGATION IR SOLN
Status: DC | PRN
  Administered 2018-02-10: 15 mL

## 2018-02-10 NOTE — Discharge Instructions (Signed)
Do not take Advil/ibuprofen or similar medications. Can take Tylenol up to 1 g/day on as-needed basis. Resume other medications as before. Pantoprazole 40 mg by mouth 30 minutes before breakfast daily. Physician will call with results of blood test. Please check with Dr. Woody Seller about getting hepatitis a and B vaccination. Office visit in October 2019 as planned. Please do not eat raw fish or shellfish.     Esophagogastroduodenoscopy, Care After Refer to this sheet in the next few weeks. These instructions provide you with information about caring for yourself after your procedure. Your health care provider may also give you more specific instructions. Your treatment has been planned according to current medical practices, but problems sometimes occur. Call your health care provider if you have any problems or questions after your procedure. What can I expect after the procedure? After the procedure, it is common to have:  A sore throat.  Nausea.  Bloating.  Dizziness.  Fatigue.  Follow these instructions at home:  Do not eat or drink anything until the numbing medicine (local anesthetic) has worn off and your gag reflex has returned. You will know that the local anesthetic has worn off when you can swallow comfortably.  Do not drive for 24 hours if you received a medicine to help you relax (sedative).  If your health care provider took a tissue sample for testing during the procedure, make sure to get your test results. This is your responsibility. Ask your health care provider or the department performing the test when your results will be ready.  Keep all follow-up visits as told by your health care provider. This is important. Contact a health care provider if:  You cannot stop coughing.  You are not urinating.  You are urinating less than usual. Get help right away if:  You have trouble swallowing.  You cannot eat or drink.  You have throat or chest pain that gets  worse.  You are dizzy or light-headed.  You faint.  You have nausea or vomiting.  You have chills.  You have a fever.  You have severe abdominal pain.  You have black, tarry, or bloody stools. This information is not intended to replace advice given to you by your health care provider. Make sure you discuss any questions you have with your health care provider. Document Released: 08/23/2012 Document Revised: 02/12/2016 Document Reviewed: 07/31/2015 Elsevier Interactive Patient Education  [employer].

## 2018-02-10 NOTE — Op Note (Signed)
[facility] Patient Name: Jeremy Chavez Procedure Date: 02/10/2018 12:36 PM MRN: [id number] Date of Birth: 1951/04/09 Attending MD: Hildred Laser , MD CSN: [id number] Age: 67 Admit Type: Outpatient Procedure:                Upper GI endoscopy Indications:              Follow-up of esophageal varices, For therapy of                            esophageal varices Providers:                Hildred Laser, MD, Charlsie Quest. Theda Sers RN, RN, Aram Candela Referring MD:             Glenda Chroman, MD Medicines:                Lidocaine spray, Meperidine 50 mg IV, Midazolam 6                            mg IV Complications:            No immediate complications. Estimated Blood Loss:     Estimated blood loss: none. Procedure:                Pre-Anesthesia Assessment:                           - Prior to the procedure, a History and Physical                            was performed, and patient medications and                            allergies were reviewed. The patient's tolerance of                            previous anesthesia was also reviewed. The risks                            and benefits of the procedure and the sedation                            options and risks were discussed with the patient.                            All questions were answered, and informed consent                            was obtained. Prior Anticoagulants: The patient                            last took ibuprofen 3 days prior to the procedure.  ASA Grade Assessment: III - A patient with severe                            systemic disease. After reviewing the risks and                            benefits, the patient was deemed in satisfactory                            condition to undergo the procedure.                           After obtaining informed consent, the endoscope was                            passed under direct vision. Throughout the                             procedure, the patient's blood pressure, pulse, and                            oxygen saturations were monitored continuously. The                            [id number] ([id number]) scope was introduced through the                            mouth, and advanced to the second part of duodenum.                            The upper GI endoscopy was accomplished without                            difficulty. The patient tolerated the procedure                            well. Scope In: 1:50:08 PM Scope Out: 1:56:15 PM Total Procedure Duration: 0 hours 6 minutes 7 seconds  Findings:      The proximal esophagus and mid esophagus were normal.      Grade I, grade II varices were found in the distal esophagus.      The Z-line was irregular and was found 46 cm from the incisors.      Moderate portal hypertensive gastropathy was found in the entire       examined stomach.      Three non-bleeding cratered gastric ulcers with no stigmata of bleeding       were found in the prepyloric region of the stomach.      The exam of the stomach was otherwise normal.      A few erosions without bleeding were found in the duodenal bulb.      The second portion of the duodenum was normal. Impression:               - Normal proximal esophagus and mid esophagus.                           -  Grade I (two columns) and grade II ( one column)                            esophageal varices.                           - Z-line irregular, 46 cm from the incisors.                           - Portal hypertensive gastropathy.                           - Non-bleeding gastric ulcers with no stigmata of                            bleeding.                           - Duodenal erosions without bleeding.                           - Normal second portion of the duodenum.                           - No specimens collected. Moderate Sedation:      Moderate (conscious) sedation was administered by the endoscopy  nurse       and supervised by the endoscopist. The following parameters were       monitored: oxygen saturation, heart rate, blood pressure, CO2       capnography and response to care. Total physician intraservice time was       12 minutes. Recommendation:           - Patient has a contact number available for                            emergencies. The signs and symptoms of potential                            delayed complications were discussed with the                            patient. Return to normal activities tomorrow.                            Written discharge instructions were provided to the                            patient.                           - Resume previous diet today.                           - Discontinue aspirin and NSAIDs.                           - Continue  present medications.                           - Use Protonix (pantoprazole) 40 mg PO daily today.                           - Perform an H. pylori serology today. Procedure Code(s):        --- Professional ---                           [phone], Esophagogastroduodenoscopy, flexible,                            transoral; diagnostic, including collection of                            specimen(s) by brushing or washing, when performed                            (separate procedure)                           G0500, Moderate sedation services provided by the                            same physician or other qualified health care                            professional performing a gastrointestinal                            endoscopic service that sedation supports,                            requiring the presence of an independent trained                            observer to assist in the monitoring of the                            patient's level of consciousness and physiological                            status; initial 15 minutes of intra-service time;                            patient age 92  years or older (additional time may                            be reported with [phone], as appropriate) Diagnosis Code(s):        --- Professional ---                           I85.00, Esophageal varices without bleeding  K22.8, Other specified diseases of esophagus                           K76.6, Portal hypertension                           K31.89, Other diseases of stomach and duodenum                           K25.9, Gastric ulcer, unspecified as acute or                            chronic, without hemorrhage or perforation                           K26.9, Duodenal ulcer, unspecified as acute or                            chronic, without hemorrhage or perforation CPT copyright 2017 American Medical Association. All rights reserved. The codes documented in this report are preliminary and upon coder review may  be revised to meet current compliance requirements. Hildred Laser, MD Hildred Laser, MD 02/10/2018 2:12:08 PM This report has been signed electronically. Number of Addenda: 0

## 2018-02-10 NOTE — H&P (Signed)
Jeremy Chavez is an 67 y.o. male.   Chief Complaint: Patient is here for EGD and possible esophageal variceal banding. HPI: Patient is 67 year old Caucasian male who has cirrhosis secondary to NAFLD complicated by hepatic encephalopathy.  He is here for EGD to screen and treat esophageal varices.  Underwent MR abdomen to evaluate her graft renal lesion which turned out to be complex cyst.  This study also revealed splenomegaly and gastroesophageal varices. Patient denies nausea vomiting heartburn dysphagia melena or rectal bleeding. Last EGD in January 2016 at currently in clinic was negative for esophageal varices  Past Medical History:  Diagnosis Date  . Diabetes ([place])   . Hypothyroid   . NAFLD (nonalcoholic fatty liver disease)     Past Surgical History:  Procedure Laterality Date  . carpal tunnel rt hand    . umblical hernia repair      Family History  Problem Relation Age of Onset  . Liver cancer Father    Social History:  reports that he has quit smoking. He has never used smokeless tobacco. He reports that he does not drink alcohol or use drugs.  Allergies: No Known Allergies  Medications Prior to Admission  Medication Sig Dispense Refill  . ferrous sulfate 325 (65 FE) MG tablet Take 325 mg by mouth 2 (two) times daily.    Marland Kitchen ibuprofen (ADVIL,MOTRIN) 200 MG tablet Take 400 mg by mouth every 8 (eight) hours as needed (for pain/headaches.).    Marland Kitchen lactulose (CHRONULAC) 10 GM/15ML solution Take 20 g by mouth 3 (three) times daily.     Marland Kitchen levothyroxine (SYNTHROID, LEVOTHROID) 125 MCG tablet Take 125 mcg by mouth daily before breakfast.    . lisinopril-hydrochlorothiazide (PRINZIDE,ZESTORETIC) 20-25 MG tablet Take 1 tablet by mouth every evening.     . metFORMIN (GLUCOPHAGE) 1000 MG tablet Take 1,000 mg by mouth 2 (two) times daily with a meal.      No results found for this or any previous visit (from the past 48 hour(s)). No results found.  ROS  Blood pressure (!) 107/59,  pulse 78, temperature 98.3 F (36.8 C), temperature source Oral, resp. rate 14, height 6' 1"  (1.854 m), weight 240 lb (108.9 kg), SpO2 96 %. Physical Exam  Constitutional: He appears well-developed and well-nourished.  HENT:  Mouth/Throat: Oropharynx is clear and moist.  Eyes: Conjunctivae are normal. No scleral icterus.  Neck: No thyromegaly present.  Cardiovascular: Normal rate, regular rhythm and normal heart sounds.  No murmur heard. Respiratory: Effort normal and breath sounds normal.  GI:  Abdomen is full but soft and nontender without organomegaly or masses.  Musculoskeletal:  1+ pitting edema around ankles.  Lymphadenopathy:    He has no cervical adenopathy.  Neurological: He is alert.  Skin: Skin is warm and dry.     Assessment/Plan Cirrhosis secondary to NAFLD. EEG to screen for and treat esophageal varices.  Hildred Laser, MD 02/10/2018, 1:38 PM

## 2018-02-14 LAB — H. PYLORI ANTIBODY, IGG: H Pylori IgG: <0.8 Index Value (ref 0.00–0.79)

## 2018-02-16 ENCOUNTER — Encounter (HOSPITAL_COMMUNITY): Payer: Self-pay | Admitting: Internal Medicine

## 2018-02-20 DIAGNOSIS — K7030 Alcoholic cirrhosis of liver without ascites: Secondary | ICD-10-CM | POA: Diagnosis not present

## 2018-02-21 LAB — AMMONIA: AMMONIA: 80 umol/L — AB (ref <=72)

## 2018-02-22 ENCOUNTER — Other Ambulatory Visit (INDEPENDENT_AMBULATORY_CARE_PROVIDER_SITE_OTHER): Payer: Self-pay | Admitting: *Deleted

## 2018-02-22 DIAGNOSIS — K7460 Unspecified cirrhosis of liver: Secondary | ICD-10-CM

## 2018-02-22 NOTE — Progress Notes (Signed)
ammonia 

## 2018-03-03 ENCOUNTER — Telehealth (INDEPENDENT_AMBULATORY_CARE_PROVIDER_SITE_OTHER): Payer: Self-pay | Admitting: Internal Medicine

## 2018-03-03 ENCOUNTER — Other Ambulatory Visit: Payer: Self-pay

## 2018-03-03 ENCOUNTER — Encounter (HOSPITAL_COMMUNITY): Payer: Self-pay | Admitting: Emergency Medicine

## 2018-03-03 ENCOUNTER — Emergency Department (HOSPITAL_COMMUNITY)
Admission: EM | Admit: 2018-03-03 | Discharge: 2018-03-03 | Disposition: A | Discharge status: Home / Self Care | Payer: Medicare Other | Attending: Emergency Medicine | Admitting: Emergency Medicine

## 2018-03-03 DIAGNOSIS — Z87891 Personal history of nicotine dependence: Secondary | ICD-10-CM | POA: Insufficient documentation

## 2018-03-03 DIAGNOSIS — K7682 Hepatic encephalopathy: Secondary | ICD-10-CM

## 2018-03-03 DIAGNOSIS — R4182 Altered mental status, unspecified: Secondary | ICD-10-CM | POA: Diagnosis present

## 2018-03-03 DIAGNOSIS — K7290 Hepatic failure, unspecified without coma: Secondary | ICD-10-CM | POA: Insufficient documentation

## 2018-03-03 DIAGNOSIS — Z7984 Long term (current) use of oral hypoglycemic drugs: Secondary | ICD-10-CM | POA: Insufficient documentation

## 2018-03-03 DIAGNOSIS — Z79899 Other long term (current) drug therapy: Secondary | ICD-10-CM | POA: Diagnosis not present

## 2018-03-03 DIAGNOSIS — R000 Tachycardia, unspecified: Secondary | ICD-10-CM | POA: Diagnosis not present

## 2018-03-03 DIAGNOSIS — E039 Hypothyroidism, unspecified: Secondary | ICD-10-CM | POA: Diagnosis not present

## 2018-03-03 DIAGNOSIS — E119 Type 2 diabetes mellitus without complications: Secondary | ICD-10-CM | POA: Diagnosis not present

## 2018-03-03 DIAGNOSIS — K760 Fatty (change of) liver, not elsewhere classified: Secondary | ICD-10-CM | POA: Diagnosis not present

## 2018-03-03 LAB — AMMONIA: Ammonia: 111 umol/L — ABNORMAL HIGH (ref 9–35)

## 2018-03-03 LAB — CBC WITH DIFFERENTIAL/PLATELET
Basophils Absolute: 0 10*3/uL (ref 0.0–0.1)
Basophils Relative: 0 %
Eosinophils Absolute: 0 10*3/uL (ref 0.0–0.7)
Eosinophils Relative: 1 %
HCT: 30.6 % — ABNORMAL LOW (ref 39.0–52.0)
Hemoglobin: 10.7 g/dL — ABNORMAL LOW (ref 13.0–17.0)
Lymphocytes Relative: 22 %
Lymphs Abs: 0.7 10*3/uL (ref 0.7–4.0)
MCH: 31.8 pg (ref 26.0–34.0)
MCHC: 35 g/dL (ref 30.0–36.0)
MCV: 90.8 fL (ref 78.0–100.0)
Monocytes Absolute: 0.3 10*3/uL (ref 0.1–1.0)
Monocytes Relative: 8 %
Neutro Abs: 2 10*3/uL (ref 1.7–7.7)
Neutrophils Relative %: 69 %
Platelets: 49 10*3/uL — ABNORMAL LOW (ref 150–400)
RBC: 3.37 MIL/uL — ABNORMAL LOW (ref 4.22–5.81)
RDW: 14.4 % (ref 11.5–15.5)
WBC: 3 10*3/uL — ABNORMAL LOW (ref 4.0–10.5)

## 2018-03-03 LAB — COMPREHENSIVE METABOLIC PANEL
ALT: 30 U/L (ref 17–63)
AST: 43 U/L — ABNORMAL HIGH (ref 15–41)
Albumin: 3.7 g/dL (ref 3.5–5.0)
Alkaline Phosphatase: 135 U/L — ABNORMAL HIGH (ref 38–126)
Anion gap: 8 (ref 5–15)
BUN: 15 mg/dL (ref 6–20)
CO2: 23 mmol/L (ref 22–32)
Calcium: 11.4 mg/dL — ABNORMAL HIGH (ref 8.9–10.3)
Chloride: 108 mmol/L (ref 101–111)
Creatinine, Ser: 0.94 mg/dL (ref 0.61–1.24)
GFR calc Af Amer: >60 mL/min (ref >60)
GFR calc non Af Amer: >60 mL/min (ref >60)
Glucose, Bld: 147 mg/dL — ABNORMAL HIGH (ref 65–99)
Potassium: 3.5 mmol/L (ref 3.5–5.1)
Sodium: 139 mmol/L (ref 135–145)
Total Bilirubin: 2 mg/dL — ABNORMAL HIGH (ref 0.3–1.2)
Total Protein: 6.9 g/dL (ref 6.5–8.1)

## 2018-03-03 LAB — URINALYSIS, ROUTINE W REFLEX MICROSCOPIC
Bilirubin Urine: NEGATIVE
Glucose, UA: NEGATIVE mg/dL
Hgb urine dipstick: NEGATIVE
Ketones, ur: NEGATIVE mg/dL
Leukocytes, UA: NEGATIVE
Nitrite: NEGATIVE
Protein, ur: NEGATIVE mg/dL
Specific Gravity, Urine: 1.017 (ref 1.005–1.030)
pH: 5 (ref 5.0–8.0)

## 2018-03-03 MED ORDER — LACTULOSE 10 GM/15ML PO SOLN
30.0000 g | Freq: Once | ORAL | Status: AC
  Administered 2018-03-03: 30 g via ORAL
  Filled 2018-03-03: qty 60

## 2018-03-03 NOTE — ED Triage Notes (Signed)
Wife states pt has been confused started x 2 weeks. Hx of liver disease, takes lactulose daily. Having bms. Confusion and exaggerated laughing noted during triage. abd distention and ble swelling noted which wife states is worse than usual.

## 2018-03-03 NOTE — Telephone Encounter (Signed)
Emmaus per daughter is confused this morning. His ammonia is probably elevated. I advised her he probably needed to be evaluated in the ED at [facility]. I called and talked with Wyona Almas to give them a heads up.

## 2018-03-03 NOTE — ED Provider Notes (Signed)
[facility] EMERGENCY DEPARTMENT Provider Note   CSN: [id number] Arrival date & time: 03/03/18  0844     History   Chief Complaint Chief Complaint  Patient presents with  . Altered Mental Status    HPI Jeremy Chavez is a 67 y.o. male.  HPI   67 year old male with change of mental status.  History is supplemented by family.  They noted changes over the last week or so.  Confused at times.   Very forgetful.  Patient himself has no acute complaints.  Past history significant for cirrhosis.  He is on lactulose.  Family reports compliance with his medicines.  Denies any fevers.  No recent medication changes.  No trauma.  Past Medical History:  Diagnosis Date  . Diabetes ([place])   . Hypothyroid   . NAFLD (nonalcoholic fatty liver disease)     Patient Active Problem List   Diagnosis Date Noted  . Cirrhosis of liver without ascites ([place]) 01/11/2018  . Hypothyroid   . Diabetes ([place])   . NAFLD (nonalcoholic fatty liver disease)     Past Surgical History:  Procedure Laterality Date  . carpal tunnel rt hand    . ESOPHAGOGASTRODUODENOSCOPY N/A 02/10/2018   Procedure: ESOPHAGOGASTRODUODENOSCOPY (EGD);  Surgeon: Rogene Houston, MD;  Location: [facility] ENDO SUITE;  Service: Endoscopy;  Laterality: N/A;  7:62  . umblical hernia repair          Home Medications    Prior to Admission medications   Medication Sig Start Date End Date Taking? Authorizing Provider  ferrous sulfate 325 (65 FE) MG tablet Take 325 mg by mouth 2 (two) times daily.    [provider]  lactulose (CHRONULAC) 10 GM/15ML solution Take 20 g by mouth 3 (three) times daily.     [provider]  levothyroxine (SYNTHROID, LEVOTHROID) 125 MCG tablet Take 125 mcg by mouth daily before breakfast.    [provider]  lisinopril-hydrochlorothiazide (PRINZIDE,ZESTORETIC) 20-25 MG tablet Take 1 tablet by mouth every evening.     [provider]  metFORMIN (GLUCOPHAGE) 1000 MG tablet Take  1,000 mg by mouth 2 (two) times daily with a meal.    [provider]  pantoprazole (PROTONIX) 40 MG tablet Take 1 tablet (40 mg total) by mouth daily before breakfast. 02/10/18   Rehman, Mechele Dawley, MD    Family History Family History  Problem Relation Age of Onset  . Liver cancer Father     Social History Social History   Tobacco Use  . Smoking status: Former Research scientist (life sciences)  . Smokeless tobacco: Never Used  Substance Use Topics  . Alcohol use: No    Frequency: Never  . Drug use: No     Allergies   Patient has no known allergies.   Review of Systems Review of Systems  All systems reviewed and negative, other than as noted in HPI.  Physical Exam Updated Vital Signs BP (!) 151/82 (BP Location: Right Arm)   Pulse 100   Temp 98.1 F (36.7 C) (Oral)   Resp 18   SpO2 99%   Physical Exam  Constitutional: He appears well-developed and well-nourished. No distress.  HENT:  Head: Normocephalic and atraumatic.  Eyes: Pupils are equal, round, and reactive to light. Conjunctivae are normal. Right eye exhibits no discharge. Left eye exhibits no discharge.  Neck: Neck supple.  Cardiovascular: Normal rate, regular rhythm and normal heart sounds. Exam reveals no gallop and no friction rub.  No murmur heard. Pulmonary/Chest: Effort normal and breath sounds normal.  No respiratory distress.  Abdominal: Soft. He exhibits distension. There is no tenderness.  Mild distention.  Soft.  Nontender.  Musculoskeletal: He exhibits no edema or tenderness.  Neurological: He is alert.  Oriented to place and time.  Able to tell me the day of the week and the month but not the exact date otherwise.  He loses his train of thought at times but otherwise his mentation seems fine.  Cranial nerves II through XII are intact.  Strength is 5 out of 5 bilateral upper and lower extremities.  Sensation is intact light touch.  Skin: Skin is warm and dry. He is not diaphoretic.  Psychiatric: He has a normal  mood and affect. His behavior is normal. Thought content normal.  Nursing note and vitals reviewed.    ED Treatments / Results  Labs (all labs ordered are listed, but only abnormal results are displayed) Labs Reviewed  COMPREHENSIVE METABOLIC PANEL - Abnormal; Notable for the following components:      Result Value   Glucose, Bld 147 (*)    Calcium 11.4 (*)    AST 43 (*)    Alkaline Phosphatase 135 (*)    Total Bilirubin 2.0 (*)    All other components within normal limits  CBC WITH DIFFERENTIAL/PLATELET - Abnormal; Notable for the following components:   WBC 3.0 (*)    RBC 3.37 (*)    Hemoglobin 10.7 (*)    HCT 30.6 (*)    Platelets 49 (*)    All other components within normal limits  AMMONIA - Abnormal; Notable for the following components:   Ammonia 111 (*)    All other components within normal limits  URINALYSIS, ROUTINE W REFLEX MICROSCOPIC    EKG None  Radiology No results found.  Procedures Procedures (including critical care time)  Medications Ordered in ED Medications  lactulose (CHRONULAC) 10 GM/15ML solution 30 g (30 g Oral Given 03/03/18 1009)     Initial Impression / Assessment and Plan / ED Course  I have reviewed the triage vital signs and the nursing notes.  Pertinent labs & imaging results that were available during my care of the patient were reviewed by me and considered in my medical decision making (see chart for details).     Ammonia 111. Likely some component of hepatic encephalopathy. It's not to the point where I feel he requires hospitalization. Has good family support. Will have them increase lactulose through the weekend. He and family are aware to limit excessive dietary protein.  Re-evaluation if symptoms don't improve. Return to ED for worsening. Also, mild hypercalcemia. Monitor for know. Fluids encouraged.   Final Clinical Impressions(s) / ED Diagnoses   Final diagnoses:  Hepatic encephalopathy [facility])    ED Discharge Orders      None      Virgel Manifold, MD 03/05/18 1346

## 2018-03-03 NOTE — Discharge Instructions (Addendum)
Increase lactulose to 5 times per day through the weekend then resume previous dosing of 3x/day.

## 2018-03-10 ENCOUNTER — Telehealth (INDEPENDENT_AMBULATORY_CARE_PROVIDER_SITE_OTHER): Payer: Self-pay | Admitting: *Deleted

## 2018-03-10 NOTE — Telephone Encounter (Signed)
Patient's wife called and she states that on last Friday patient was taken to Ed because he was confused. His ammonia level was 111. They were advised by the ED to increase the Lactulose to 5 times a day for the weekend. Wife states that he had no BM, he was to return to normal schedule on Monday . He did and he has had a BM every day. He appeared better on Tuesday.  Wife says that he is sleeping more,moving slower, and seems to be not thinking clear this morning.  She does not know if he needs to see a doctor or what she should watch for with this his Liver Disease.  Patient appears to have has procedure 02/15/2018 and next appointment is 07/13/2018 with Raeanne Gathers.

## 2018-03-10 NOTE — Telephone Encounter (Signed)
Talked with Dr.Rehman. He ask that we call in the Peri Colace -patient is to take 1 by  Mouth twice a day #60 with 5 refills , and Xifaxan 550 mg - take 1 by mouth twice a day. # 60 5 refills.  This has been called to the patient's pharmacy. Wife was advised and also made aware that the Insurance may not pay for the Xifaxan.If these abnormal clinical findings persist, appropriate workup will be completed. The patient understands that follow up is required to elucidate the situation. Not to call us on Monday.  Dr.Rehman ask that the patient be given appointment for July.

## 2018-03-13 ENCOUNTER — Encounter (INDEPENDENT_AMBULATORY_CARE_PROVIDER_SITE_OTHER): Payer: Self-pay | Admitting: *Deleted

## 2018-03-13 ENCOUNTER — Other Ambulatory Visit (INDEPENDENT_AMBULATORY_CARE_PROVIDER_SITE_OTHER): Payer: Self-pay | Admitting: *Deleted

## 2018-03-13 DIAGNOSIS — K7460 Unspecified cirrhosis of liver: Secondary | ICD-10-CM

## 2018-03-20 DIAGNOSIS — Z1339 Encounter for screening examination for other mental health and behavioral disorders: Secondary | ICD-10-CM | POA: Diagnosis not present

## 2018-03-20 DIAGNOSIS — E039 Hypothyroidism, unspecified: Secondary | ICD-10-CM | POA: Diagnosis not present

## 2018-03-20 DIAGNOSIS — I10 Essential (primary) hypertension: Secondary | ICD-10-CM | POA: Diagnosis not present

## 2018-03-20 DIAGNOSIS — E1165 Type 2 diabetes mellitus with hyperglycemia: Secondary | ICD-10-CM | POA: Diagnosis not present

## 2018-03-20 DIAGNOSIS — Z299 Encounter for prophylactic measures, unspecified: Secondary | ICD-10-CM | POA: Diagnosis not present

## 2018-03-20 DIAGNOSIS — K7460 Unspecified cirrhosis of liver: Secondary | ICD-10-CM | POA: Diagnosis not present

## 2018-03-20 DIAGNOSIS — Z7189 Other specified counseling: Secondary | ICD-10-CM | POA: Diagnosis not present

## 2018-03-20 DIAGNOSIS — Z1211 Encounter for screening for malignant neoplasm of colon: Secondary | ICD-10-CM | POA: Diagnosis not present

## 2018-03-20 DIAGNOSIS — Z1331 Encounter for screening for depression: Secondary | ICD-10-CM | POA: Diagnosis not present

## 2018-03-20 DIAGNOSIS — Z0000 Encounter for general adult medical examination without abnormal findings: Secondary | ICD-10-CM | POA: Diagnosis not present

## 2018-03-20 DIAGNOSIS — R5383 Other fatigue: Secondary | ICD-10-CM | POA: Diagnosis not present

## 2018-03-20 DIAGNOSIS — Z6831 Body mass index (BMI) 31.0-31.9, adult: Secondary | ICD-10-CM | POA: Diagnosis not present

## 2018-03-21 DIAGNOSIS — R5383 Other fatigue: Secondary | ICD-10-CM | POA: Diagnosis not present

## 2018-03-21 DIAGNOSIS — E039 Hypothyroidism, unspecified: Secondary | ICD-10-CM | POA: Diagnosis not present

## 2018-03-21 DIAGNOSIS — E7800 Pure hypercholesterolemia, unspecified: Secondary | ICD-10-CM | POA: Diagnosis not present

## 2018-03-21 DIAGNOSIS — Z125 Encounter for screening for malignant neoplasm of prostate: Secondary | ICD-10-CM | POA: Diagnosis not present

## 2018-03-21 DIAGNOSIS — Z79899 Other long term (current) drug therapy: Secondary | ICD-10-CM | POA: Diagnosis not present

## 2018-03-22 DIAGNOSIS — K7460 Unspecified cirrhosis of liver: Secondary | ICD-10-CM | POA: Diagnosis not present

## 2018-03-23 LAB — AMMONIA: AMMONIA: 138 umol/L — AB (ref <=72)

## 2018-03-27 ENCOUNTER — Encounter (INDEPENDENT_AMBULATORY_CARE_PROVIDER_SITE_OTHER): Payer: Self-pay | Admitting: Internal Medicine

## 2018-03-27 ENCOUNTER — Ambulatory Visit (INDEPENDENT_AMBULATORY_CARE_PROVIDER_SITE_OTHER): Payer: Medicare Other | Admitting: Internal Medicine

## 2018-03-27 VITALS — BP 120/58 | HR 68 | Temp 97.8°F | Ht 73.0 in | Wt 232.0 lb

## 2018-03-27 DIAGNOSIS — K760 Fatty (change of) liver, not elsewhere classified: Secondary | ICD-10-CM | POA: Diagnosis not present

## 2018-03-27 NOTE — Patient Instructions (Addendum)
OV in 4 months. Continue to Lactulose.

## 2018-03-27 NOTE — Progress Notes (Signed)
Subjective:    Patient ID: Jeremy Chavez, male    DOB: 12/02/1950, 67 y.o.   MRN: [id number]  HPI Here today for f/u. Last seen in April of 2019.  Hx of NAFLD/cirrhoisis. Seen in the ED with mental status changes. Ammonia 111 in the ED. He has had some sleepiness and uncoordinated at times. His appetite is okay. He is having a BM at least twice a day.  No melena or BRRB. He is trying a walk 2 miles a day.  He says he feels pretty good most of the time.     03/22/2018 Ammonia 138.   02/10/2018 EGD Follow up esophageal varices. For therapy of esophageal varices.  Grade 1, grade 2 varices found in distal esophagus.  Moderate portal hypertensive gastropathy found in entire examined stomach,  Three non bleeding cratered ulcers with no stigmata of bleeding were found the the pre pyloric region of stomach.   CBC    Component Value Date/Time   WBC 3.0 (L) 03/03/2018 0913   RBC 3.37 (L) 03/03/2018 0913   HGB 10.7 (L) 03/03/2018 0913   HCT 30.6 (L) 03/03/2018 0913   PLT 49 (L) 03/03/2018 0913   MCV 90.8 03/03/2018 0913   MCH 31.8 03/03/2018 0913   MCHC 35.0 03/03/2018 0913   RDW 14.4 03/03/2018 0913   LYMPHSABS 0.7 03/03/2018 0913   MONOABS 0.3 03/03/2018 0913   EOSABS 0.0 03/03/2018 0913   BASOSABS 0.0 03/03/2018 0913      Hx of diabetes, cirrhosis, hypothyroidism, hypertension Evaluated at [facility] in [facility] was was told he had cirrhosis in 2015 Family hx of fatty liver in a sister who is now deceased at age 26.     05-Feb-2009 IgG 1,277, SMA less 20, ANA negative, sedrate 6, 10/10/2017 Ceruloplasmin, alpha 1 antitrypsin normal.  Acute hepatitis panel negative.      10/07/2014 EGD/colonoscopy: The duodenum revealed some duodenitis with erythematous patches. Stomach itself revealed antral gastritis with erosions. Biopsies were taken and will be checked for Helicobacter. There was mild reflux esophagitis at the EG junction. Again, biopsies were taken here and will  be checked for Barrett's. He had some minimal dilatation of some esophageal veins. Pediatric colonoscope was introduced into the rectum and manipulated up to the cecum. Bowel prep was not good. He had diverticulosis of the left colon. He had some prominent erythematous blushy type vessels in the cecum.     Review of Systems Past Medical History:  Diagnosis Date  . Diabetes ([place])   . Hypothyroid   . NAFLD (nonalcoholic fatty liver disease)     Past Surgical History:  Procedure Laterality Date  . carpal tunnel rt hand    . ESOPHAGOGASTRODUODENOSCOPY N/A 02/10/2018   Procedure: ESOPHAGOGASTRODUODENOSCOPY (EGD);  Surgeon: Rogene Houston, MD;  Location: [facility] ENDO SUITE;  Service: Endoscopy;  Laterality: N/A;  9:62  . umblical hernia repair      No Known Allergies  Current Outpatient Medications on File Prior to Visit  Medication Sig Dispense Refill  . ferrous sulfate 325 (65 FE) MG tablet Take 325 mg by mouth 2 (two) times daily.    Marland Kitchen lactulose (CHRONULAC) 10 GM/15ML solution Take 20 g by mouth 3 (three) times daily.     Marland Kitchen levothyroxine (SYNTHROID, LEVOTHROID) 125 MCG tablet Take 125 mcg by mouth daily before breakfast.    . lisinopril-hydrochlorothiazide (PRINZIDE,ZESTORETIC) 20-25 MG tablet Take 1 tablet by mouth every evening.     . metFORMIN (GLUCOPHAGE) 1000 MG tablet Take 1,000  mg by mouth daily with breakfast.     . pantoprazole (PROTONIX) 40 MG tablet Take 1 tablet (40 mg total) by mouth daily before breakfast. (Patient taking differently: Take 40 mg by mouth 2 (two) times daily before a meal. ) 30 tablet 5   No current facility-administered medications on file prior to visit.         Objective:   Physical Exam Blood pressure (!) 120/58, pulse 68, temperature 97.8 F (36.6 C), height 6' 1"  (1.854 m), weight 232 lb (105.2 kg). Alert and oriented. Skin warm and dry. Oral mucosa is moist.   . Sclera anicteric, conjunctivae is pink. Thyroid not enlarged. No cervical  lymphadenopathy. Lungs clear. Heart regular rate and rhythm.  Abdomen is soft. Bowel sounds are positive. No hepatomegaly. No abdominal masses felt. No tenderness.  No edema to lower extremities.  Heart murmur heard.         Assessment & Plan:  NAFLD/Cirrhosis.  Hepatic encephalopathy. Continue the Lactulose. OV in 4 months.  Ammonia in 2 weeks. Samples of Xifaxan x 3 boxes given to patient. [facility] and B [facility].

## 2018-03-28 ENCOUNTER — Telehealth (INDEPENDENT_AMBULATORY_CARE_PROVIDER_SITE_OTHER): Payer: Self-pay | Admitting: Internal Medicine

## 2018-03-28 NOTE — Telephone Encounter (Signed)
Wife called stated pharmacy would not refill his pantoprazole 40 mg until the 15th - stated the reason he is out Dr Laural Golden told him to take 2 pills per day - he uses [facility] in [place]

## 2018-03-28 NOTE — Telephone Encounter (Signed)
It looks like he has refills at [facility].

## 2018-04-03 ENCOUNTER — Encounter (INDEPENDENT_AMBULATORY_CARE_PROVIDER_SITE_OTHER): Payer: Self-pay | Admitting: *Deleted

## 2018-04-03 ENCOUNTER — Other Ambulatory Visit (INDEPENDENT_AMBULATORY_CARE_PROVIDER_SITE_OTHER): Payer: Self-pay | Admitting: *Deleted

## 2018-04-03 ENCOUNTER — Other Ambulatory Visit (INDEPENDENT_AMBULATORY_CARE_PROVIDER_SITE_OTHER): Payer: Self-pay | Admitting: Internal Medicine

## 2018-04-03 DIAGNOSIS — K7460 Unspecified cirrhosis of liver: Secondary | ICD-10-CM

## 2018-04-03 DIAGNOSIS — K760 Fatty (change of) liver, not elsewhere classified: Secondary | ICD-10-CM

## 2018-04-03 DIAGNOSIS — D485 Neoplasm of uncertain behavior of skin: Secondary | ICD-10-CM | POA: Diagnosis not present

## 2018-04-03 DIAGNOSIS — Z6831 Body mass index (BMI) 31.0-31.9, adult: Secondary | ICD-10-CM | POA: Diagnosis not present

## 2018-04-03 DIAGNOSIS — L821 Other seborrheic keratosis: Secondary | ICD-10-CM | POA: Diagnosis not present

## 2018-04-03 DIAGNOSIS — I10 Essential (primary) hypertension: Secondary | ICD-10-CM | POA: Diagnosis not present

## 2018-04-03 DIAGNOSIS — Z299 Encounter for prophylactic measures, unspecified: Secondary | ICD-10-CM | POA: Diagnosis not present

## 2018-04-03 MED ORDER — PANTOPRAZOLE SODIUM 40 MG PO TBEC
40.0000 mg | DELAYED_RELEASE_TABLET | Freq: Two times a day (BID) | ORAL | 2 refills | Status: DC
Start: 2018-04-03 — End: 2018-09-26

## 2018-04-03 NOTE — Progress Notes (Unsigned)
Prescription for pantoprazole sent to patient's pharmacy.  Will change pantoprazole to once daily after 3 months.

## 2018-04-10 DIAGNOSIS — K760 Fatty (change of) liver, not elsewhere classified: Secondary | ICD-10-CM | POA: Diagnosis not present

## 2018-04-10 DIAGNOSIS — K7460 Unspecified cirrhosis of liver: Secondary | ICD-10-CM | POA: Diagnosis not present

## 2018-04-11 LAB — AMMONIA: Ammonia: 141 umol/L — ABNORMAL HIGH (ref <=72)

## 2018-04-12 ENCOUNTER — Other Ambulatory Visit (INDEPENDENT_AMBULATORY_CARE_PROVIDER_SITE_OTHER): Payer: Self-pay | Admitting: *Deleted

## 2018-04-12 DIAGNOSIS — K760 Fatty (change of) liver, not elsewhere classified: Secondary | ICD-10-CM

## 2018-04-12 IMAGING — US US ABDOMEN COMPLETE
1 series · 13 of 25 positions shown · non-contrast
Comparison: None.

CLINICAL DATA: Fatty liver

EXAM:
ABDOMEN ULTRASOUND COMPLETE

[Series 1: us abdomen complete · 0.25mm/px · 13 of 100 slices shown]
[im 1/100]
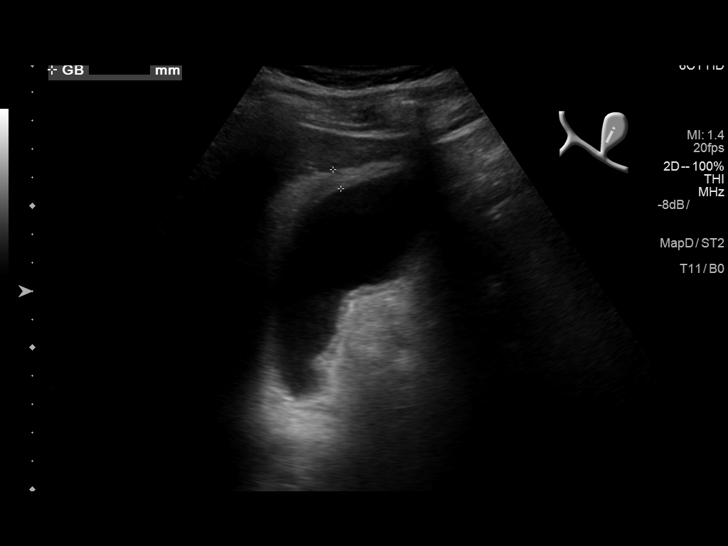
[im 9/100]
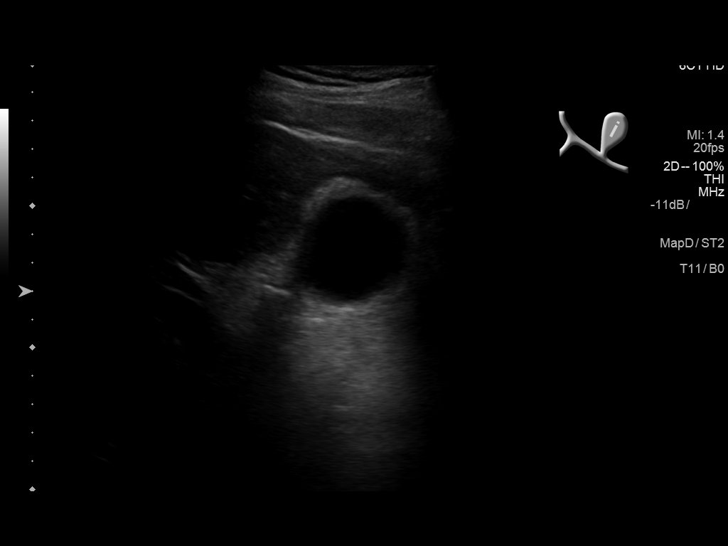
[im 17/100]
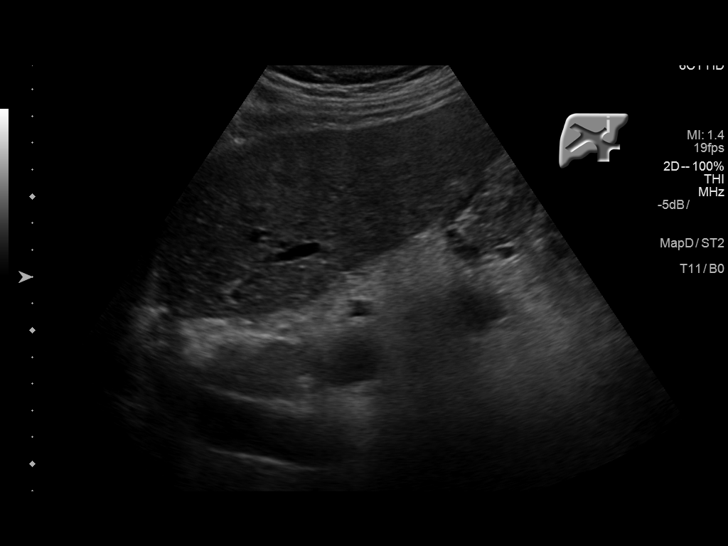
[im 25/100]
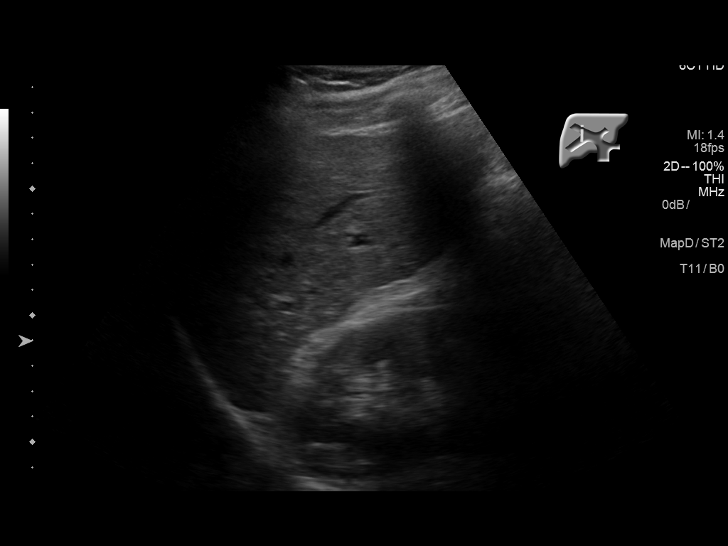
[im 34/100]
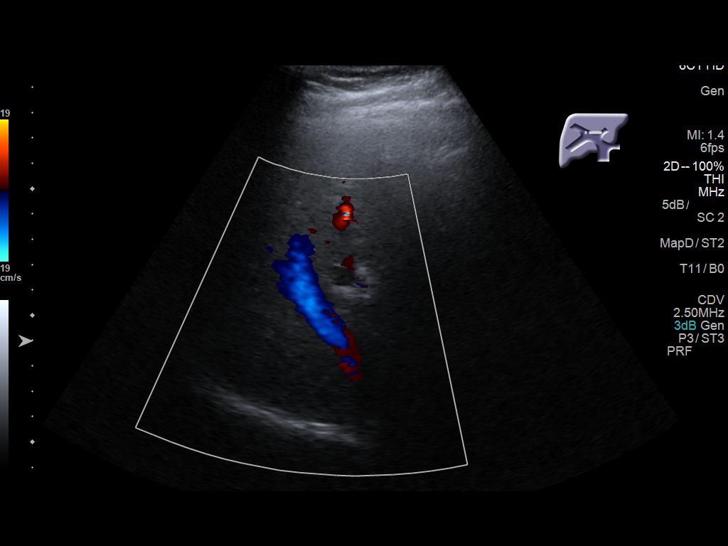
[im 42/100]
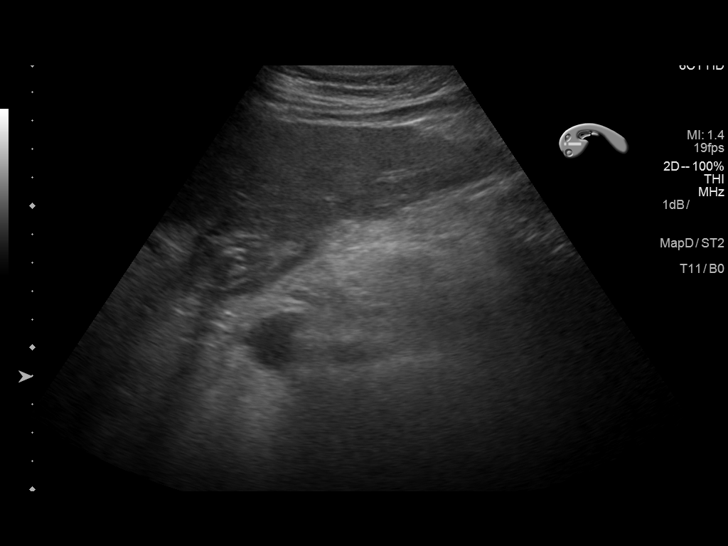
[im 50/100]
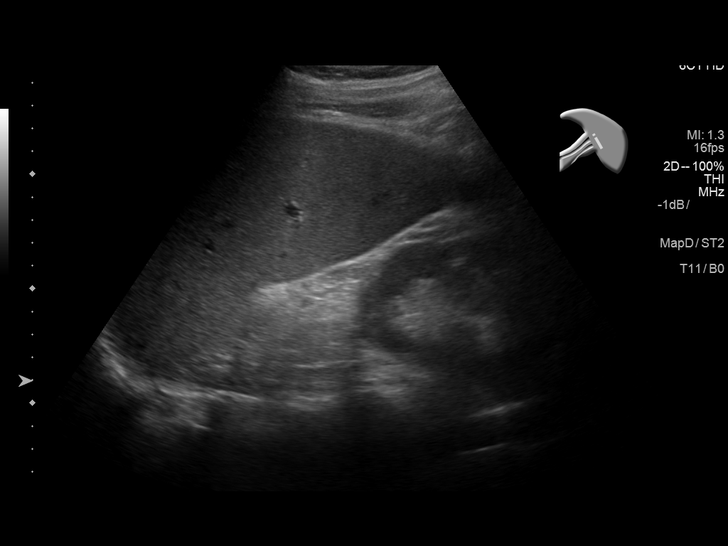
[im 58/100]
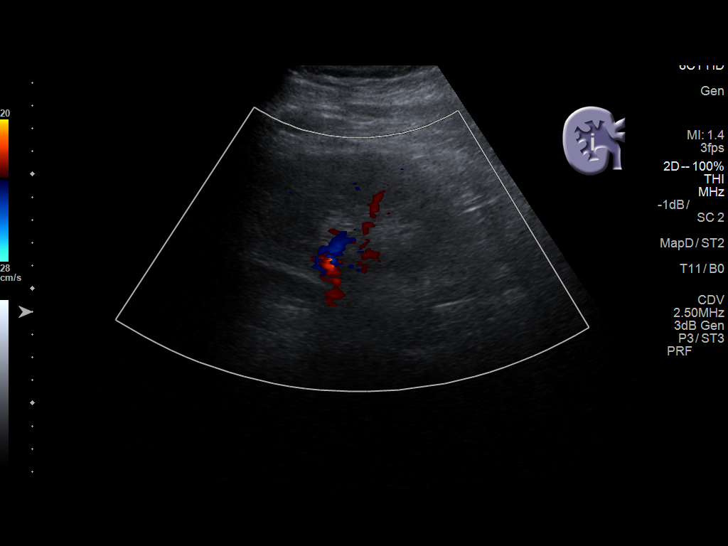
[im 67/100]
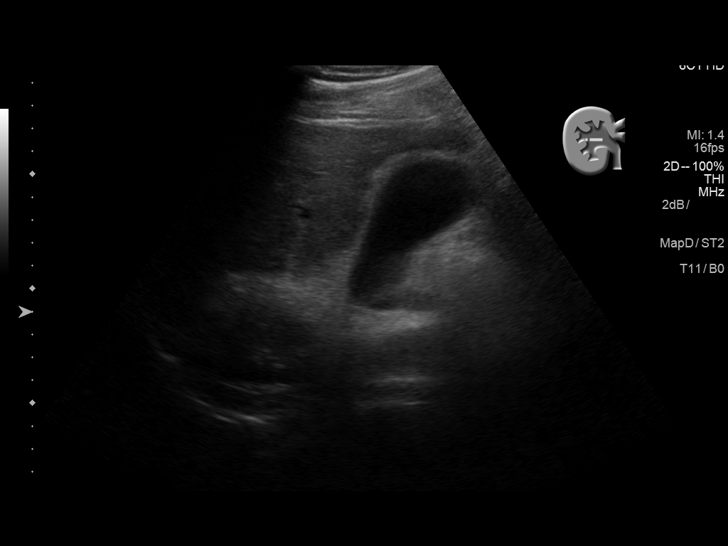
[im 75/100]
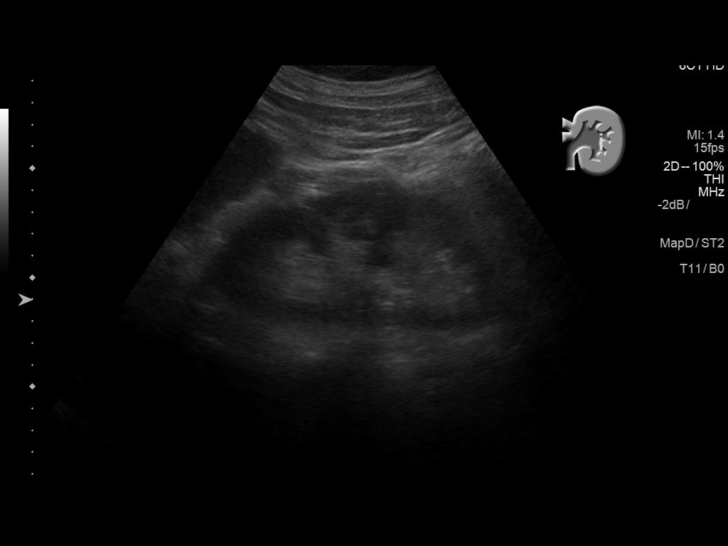
[im 83/100]
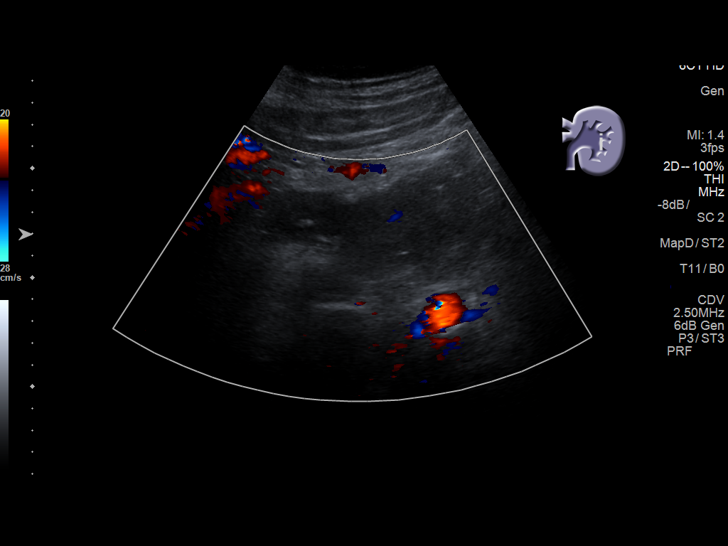
[im 91/100]
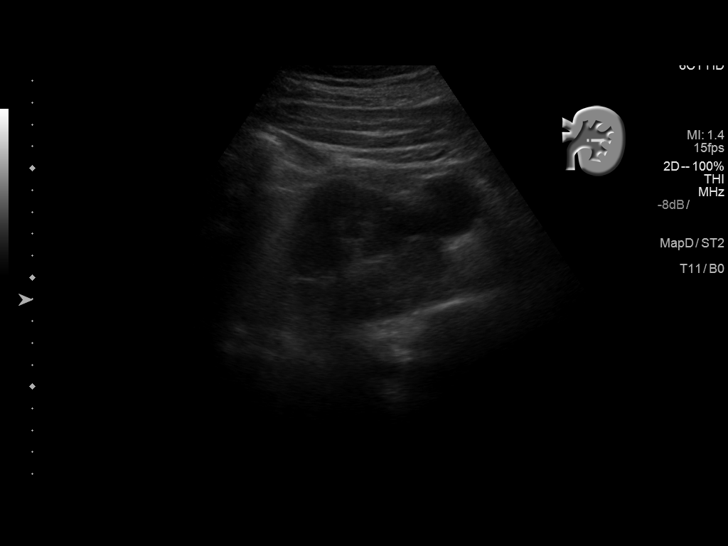
[im 100/100]
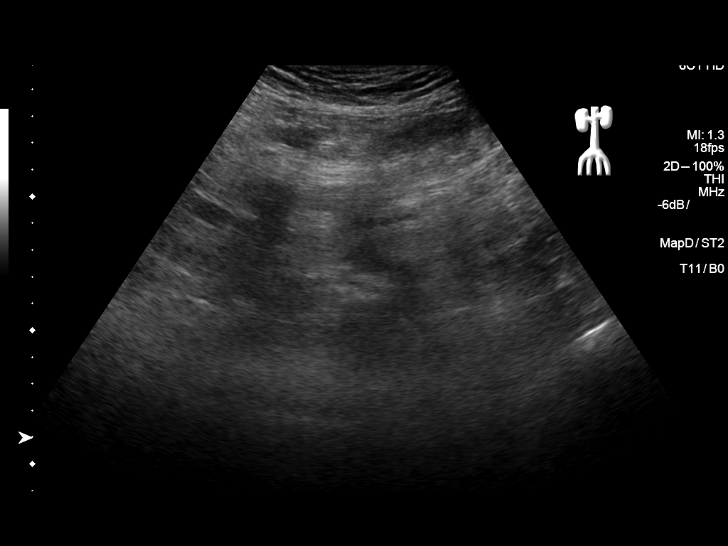

[13 of 25 positions shown; findings below may reference images not displayed]

FINDINGS: Gallbladder: No cholelithiasis is identified. Wall thickening to 7
mm is noted of uncertain etiology.

Common bile duct: Diameter: 4 mm

Liver: Mild increased echogenicity is noted consistent with the
given clinical history of fatty infiltration. Portal vein is patent
on color Doppler imaging with normal direction of blood flow towards
the liver.

IVC: No abnormality visualized.

Pancreas: Visualized portion unremarkable.

Spleen: Mild splenomegaly is noted although a normal spleniform
shape is retained. Some prominent vasculature is noted in the
splenic hilum which may represent splenic varices.

Right Kidney: Length: 14.1 cm.. Echogenicity within normal limits.
No mass or hydronephrosis visualized.

Left Kidney: Length: 14.1 cm.. No obstructive changes are noted.
There is a 4.8 cm complex cystic lesion arising from the upper pole.

Abdominal aorta: No aneurysm visualized.

Other findings: None.
IMPRESSION: Mild splenomegaly and suggestion of splenic varices.

Gallbladder wall thickening without cholelithiasis.

Complex cystic lesion arising from the upper pole of the left
kidney. MRI is recommended for further evaluation. The other
findings in the abdomen can be evaluated on this as well.

## 2018-04-13 ENCOUNTER — Telehealth (INDEPENDENT_AMBULATORY_CARE_PROVIDER_SITE_OTHER): Payer: Self-pay | Admitting: Internal Medicine

## 2018-04-13 NOTE — Telephone Encounter (Signed)
Wife called stating patients children want a second opinion and would like to be referred to [facility] or put on a transplant list - wife would like to speak to Dr Laural Golden about this - please call [phone]

## 2018-04-14 NOTE — Telephone Encounter (Signed)
This will be discussed with Dr.Rehman. 

## 2018-04-17 ENCOUNTER — Encounter (INDEPENDENT_AMBULATORY_CARE_PROVIDER_SITE_OTHER): Payer: Self-pay | Admitting: *Deleted

## 2018-04-17 ENCOUNTER — Other Ambulatory Visit (INDEPENDENT_AMBULATORY_CARE_PROVIDER_SITE_OTHER): Payer: Self-pay | Admitting: *Deleted

## 2018-04-17 DIAGNOSIS — K760 Fatty (change of) liver, not elsewhere classified: Secondary | ICD-10-CM

## 2018-04-17 NOTE — Progress Notes (Signed)
Other

## 2018-04-19 NOTE — Telephone Encounter (Signed)
Per Dr.Rehman refer patient to [facility] fro consultation for Liver Transplant.

## 2018-04-20 NOTE — Telephone Encounter (Signed)
Info sent to [facility], patient's wife aware

## 2018-04-26 DIAGNOSIS — K760 Fatty (change of) liver, not elsewhere classified: Secondary | ICD-10-CM | POA: Diagnosis not present

## 2018-04-27 LAB — AMMONIA: Ammonia: 129 umol/L — ABNORMAL HIGH (ref <=72)

## 2018-05-01 ENCOUNTER — Telehealth (INDEPENDENT_AMBULATORY_CARE_PROVIDER_SITE_OTHER): Payer: Self-pay | Admitting: Internal Medicine

## 2018-05-01 NOTE — Telephone Encounter (Signed)
noted 

## 2018-05-01 NOTE — Telephone Encounter (Signed)
Wife stated patient had blood work done last week and had called to check on results

## 2018-05-02 ENCOUNTER — Other Ambulatory Visit (INDEPENDENT_AMBULATORY_CARE_PROVIDER_SITE_OTHER): Payer: Self-pay | Admitting: *Deleted

## 2018-05-02 DIAGNOSIS — K760 Fatty (change of) liver, not elsewhere classified: Secondary | ICD-10-CM

## 2018-05-02 NOTE — Progress Notes (Signed)
Ammonia

## 2018-05-03 DIAGNOSIS — K7290 Hepatic failure, unspecified without coma: Secondary | ICD-10-CM | POA: Diagnosis not present

## 2018-05-03 DIAGNOSIS — K7460 Unspecified cirrhosis of liver: Secondary | ICD-10-CM | POA: Diagnosis not present

## 2018-05-03 DIAGNOSIS — K7581 Nonalcoholic steatohepatitis (NASH): Secondary | ICD-10-CM | POA: Diagnosis not present

## 2018-05-08 ENCOUNTER — Other Ambulatory Visit (INDEPENDENT_AMBULATORY_CARE_PROVIDER_SITE_OTHER): Payer: Self-pay | Admitting: Internal Medicine

## 2018-05-08 DIAGNOSIS — K7469 Other cirrhosis of liver: Secondary | ICD-10-CM

## 2018-05-12 ENCOUNTER — Other Ambulatory Visit (INDEPENDENT_AMBULATORY_CARE_PROVIDER_SITE_OTHER): Payer: Self-pay | Admitting: *Deleted

## 2018-05-12 ENCOUNTER — Other Ambulatory Visit (HOSPITAL_COMMUNITY): Payer: Medicare Other

## 2018-05-12 ENCOUNTER — Encounter (INDEPENDENT_AMBULATORY_CARE_PROVIDER_SITE_OTHER): Payer: Self-pay | Admitting: *Deleted

## 2018-05-12 DIAGNOSIS — K760 Fatty (change of) liver, not elsewhere classified: Secondary | ICD-10-CM

## 2018-05-16 ENCOUNTER — Ambulatory Visit (HOSPITAL_COMMUNITY)
Admission: RE | Admit: 2018-05-16 | Discharge: 2018-05-16 | Disposition: A | Discharge status: Home / Self Care | Payer: Medicare Other | Source: Ambulatory Visit | Attending: Internal Medicine | Admitting: Internal Medicine

## 2018-05-16 DIAGNOSIS — R161 Splenomegaly, not elsewhere classified: Secondary | ICD-10-CM | POA: Insufficient documentation

## 2018-05-16 DIAGNOSIS — K7469 Other cirrhosis of liver: Secondary | ICD-10-CM | POA: Insufficient documentation

## 2018-05-16 DIAGNOSIS — K766 Portal hypertension: Secondary | ICD-10-CM | POA: Insufficient documentation

## 2018-05-16 DIAGNOSIS — K7460 Unspecified cirrhosis of liver: Secondary | ICD-10-CM | POA: Diagnosis not present

## 2018-05-30 DIAGNOSIS — K760 Fatty (change of) liver, not elsewhere classified: Secondary | ICD-10-CM | POA: Diagnosis not present

## 2018-05-31 LAB — AMMONIA: AMMONIA: 161 umol/L — AB (ref <=72)

## 2018-06-12 ENCOUNTER — Other Ambulatory Visit (INDEPENDENT_AMBULATORY_CARE_PROVIDER_SITE_OTHER): Payer: Self-pay | Admitting: *Deleted

## 2018-06-12 DIAGNOSIS — K760 Fatty (change of) liver, not elsewhere classified: Secondary | ICD-10-CM

## 2018-06-14 ENCOUNTER — Other Ambulatory Visit (INDEPENDENT_AMBULATORY_CARE_PROVIDER_SITE_OTHER): Payer: Self-pay | Admitting: *Deleted

## 2018-06-14 ENCOUNTER — Encounter (INDEPENDENT_AMBULATORY_CARE_PROVIDER_SITE_OTHER): Payer: Self-pay | Admitting: *Deleted

## 2018-06-14 DIAGNOSIS — K760 Fatty (change of) liver, not elsewhere classified: Secondary | ICD-10-CM

## 2018-07-10 DIAGNOSIS — K760 Fatty (change of) liver, not elsewhere classified: Secondary | ICD-10-CM | POA: Diagnosis not present

## 2018-07-11 ENCOUNTER — Other Ambulatory Visit (INDEPENDENT_AMBULATORY_CARE_PROVIDER_SITE_OTHER): Payer: Self-pay | Admitting: *Deleted

## 2018-07-11 DIAGNOSIS — K760 Fatty (change of) liver, not elsewhere classified: Secondary | ICD-10-CM

## 2018-07-11 DIAGNOSIS — K7460 Unspecified cirrhosis of liver: Secondary | ICD-10-CM

## 2018-07-11 DIAGNOSIS — R7989 Other specified abnormal findings of blood chemistry: Secondary | ICD-10-CM

## 2018-07-11 LAB — AMMONIA: Ammonia: 170 umol/L — ABNORMAL HIGH (ref <=72)

## 2018-07-13 ENCOUNTER — Ambulatory Visit (INDEPENDENT_AMBULATORY_CARE_PROVIDER_SITE_OTHER): Payer: Medicare Other | Admitting: Internal Medicine

## 2018-07-17 ENCOUNTER — Other Ambulatory Visit (INDEPENDENT_AMBULATORY_CARE_PROVIDER_SITE_OTHER): Payer: Self-pay | Admitting: *Deleted

## 2018-07-17 ENCOUNTER — Encounter (INDEPENDENT_AMBULATORY_CARE_PROVIDER_SITE_OTHER): Payer: Self-pay | Admitting: *Deleted

## 2018-07-17 DIAGNOSIS — K7460 Unspecified cirrhosis of liver: Secondary | ICD-10-CM

## 2018-07-17 DIAGNOSIS — R7989 Other specified abnormal findings of blood chemistry: Secondary | ICD-10-CM

## 2018-07-17 DIAGNOSIS — K760 Fatty (change of) liver, not elsewhere classified: Secondary | ICD-10-CM

## 2018-07-17 NOTE — Progress Notes (Signed)
Other

## 2018-07-24 DIAGNOSIS — K7460 Unspecified cirrhosis of liver: Secondary | ICD-10-CM | POA: Diagnosis not present

## 2018-07-24 DIAGNOSIS — R7989 Other specified abnormal findings of blood chemistry: Secondary | ICD-10-CM | POA: Diagnosis not present

## 2018-07-24 DIAGNOSIS — K760 Fatty (change of) liver, not elsewhere classified: Secondary | ICD-10-CM | POA: Diagnosis not present

## 2018-07-25 LAB — AMMONIA: Ammonia: 121 umol/L — ABNORMAL HIGH (ref <=72)

## 2018-07-26 ENCOUNTER — Other Ambulatory Visit (INDEPENDENT_AMBULATORY_CARE_PROVIDER_SITE_OTHER): Payer: Self-pay | Admitting: *Deleted

## 2018-07-26 DIAGNOSIS — K760 Fatty (change of) liver, not elsewhere classified: Secondary | ICD-10-CM

## 2018-07-31 ENCOUNTER — Ambulatory Visit (INDEPENDENT_AMBULATORY_CARE_PROVIDER_SITE_OTHER): Payer: Medicare Other | Admitting: Internal Medicine

## 2018-07-31 ENCOUNTER — Encounter (INDEPENDENT_AMBULATORY_CARE_PROVIDER_SITE_OTHER): Payer: Self-pay | Admitting: Internal Medicine

## 2018-07-31 VITALS — BP 140/70 | HR 64 | Temp 98.0°F | Ht 72.0 in | Wt 233.2 lb

## 2018-07-31 DIAGNOSIS — K7460 Unspecified cirrhosis of liver: Secondary | ICD-10-CM

## 2018-07-31 DIAGNOSIS — K7290 Hepatic failure, unspecified without coma: Secondary | ICD-10-CM | POA: Diagnosis not present

## 2018-07-31 DIAGNOSIS — K7682 Hepatic encephalopathy: Secondary | ICD-10-CM

## 2018-07-31 NOTE — Patient Instructions (Addendum)
OV in 6 months.  Labs today.

## 2018-07-31 NOTE — Progress Notes (Addendum)
   Subjective:    Patient ID: Jeremy Chavez, male    DOB: [birth year], 67 y.o.   MRN: [id number]  HPI Here today for f/u. Last seen in July of this year. Hx of NAFLD/cirrhoisis. Hx of hepatic enceophalopathy. Mantained on Lactulose QID.  He tells me he is doing good. Has had a cough and sinus drainage for about a week but is better. Denies any periods of confusion.  Appetite is good. No weight loss. Does not eat beef. He eats chicken and fish. Eats fresh and frozen vegetables.  BMs are normal. Having  3-4 stools a day and are loose.   07/10/2018 ammonia 170.    02/10/2018 EGD Follow up esophageal varices. For therapy of esophageal varices.  Grade 1, grade 2 varices found in distal esophagus.  Moderate portal hypertensive gastropathy found in entire examined stomach,  Three non bleeding cratered ulcers with no stigmata of bleeding were found the the pre pyloric region of stomach.   01/11/2018 IgG 1,277, SMA less 20, ANA negative, sedrate 6, 10/10/2017 Ceruloplasmin, alpha 1 antitrypsin normal.  Acute hepatitis panel negative.      Hx of diabetes, cirrhosis, hypothyroidism, hypertension Evaluated at [facility] in [facility] was was told he had cirrhosis in 2015 Family hx of fatty liver in a sister who is now deceased at age 24.   8/27/019 US Liver doppler: 1. Hepatic cirrhosis with portal hypertension as evidenced by splenomegaly, perisplenic varices and sluggish flow within the portal veins. 2. The portal and hepatic veins remain patent with normal directional flow. Relatively sluggish flow in the portal veins is consistent with portal hypertension. 3. No discrete hepatic lesion identified.    10/07/2014 EGD/colonoscopy: The duodenum revealed some duodenitis with erythematous patches. Stomach itself revealed antral gastritis with erosions. Biopsies were taken and will be checked for Helicobacter. There was mild reflux esophagitis at the EG junction. Again,  biopsies were taken here and will be checked for Barrett's. He had some minimal dilatation of some esophageal veins. Pediatric colonoscope was introduced into the rectum and manipulated up to the cecum. Bowel prep was not good. He had diverticulosis of the left colon. He had some prominent erythematous blushy type vessels in the cecum. Review of Systems     Objective:   Physical Exam Blood pressure 140/70, pulse 64, temperature 98 F (36.7 C), height 6' (1.829 m), weight 233 lb 3.2 oz (105.8 kg). Alert and oriented. Skin warm and dry. Oral mucosa is moist.   . Sclera anicteric, conjunctivae is pink. Thyroid not enlarged. No cervical lymphadenopathy. Lungs clear. Heart regular rate and rhythm.  Abdomen is soft. Bowel sounds are positive. No hepatomegaly. No abdominal masses felt. No tenderness.  1+ edema to lower extremities.           Assessment & Plan:  Cirrhosis/NAFLD. Doing well. Hepatic and CBC today.  Hepatic encephalopathy: will repeat ammonia today.  OV in 6 months.

## 2018-08-01 DIAGNOSIS — Z299 Encounter for prophylactic measures, unspecified: Secondary | ICD-10-CM | POA: Diagnosis not present

## 2018-08-01 DIAGNOSIS — I10 Essential (primary) hypertension: Secondary | ICD-10-CM | POA: Diagnosis not present

## 2018-08-01 DIAGNOSIS — E069 Thyroiditis, unspecified: Secondary | ICD-10-CM | POA: Diagnosis not present

## 2018-08-01 DIAGNOSIS — Z6831 Body mass index (BMI) 31.0-31.9, adult: Secondary | ICD-10-CM | POA: Diagnosis not present

## 2018-08-01 DIAGNOSIS — J069 Acute upper respiratory infection, unspecified: Secondary | ICD-10-CM | POA: Diagnosis not present

## 2018-08-01 DIAGNOSIS — E039 Hypothyroidism, unspecified: Secondary | ICD-10-CM | POA: Diagnosis not present

## 2018-08-01 DIAGNOSIS — E1165 Type 2 diabetes mellitus with hyperglycemia: Secondary | ICD-10-CM | POA: Diagnosis not present

## 2018-08-01 DIAGNOSIS — K7460 Unspecified cirrhosis of liver: Secondary | ICD-10-CM | POA: Diagnosis not present

## 2018-08-01 LAB — HEPATIC FUNCTION PANEL
AG RATIO: 1.3 (calc) (ref 1.0–2.5)
ALKALINE PHOSPHATASE (APISO): 95 U/L (ref 40–115)
ALT: 16 U/L (ref 9–46)
AST: 27 U/L (ref 10–35)
Albumin: 3.5 g/dL — ABNORMAL LOW (ref 3.6–5.1)
BILIRUBIN INDIRECT: 1.4 mg/dL — AB (ref 0.2–1.2)
BILIRUBIN TOTAL: 1.9 mg/dL — AB (ref 0.2–1.2)
Bilirubin, Direct: 0.5 mg/dL — ABNORMAL HIGH (ref 0.0–0.2)
Globulin: 2.6 g/dL (calc) (ref 1.9–3.7)
Total Protein: 6.1 g/dL (ref 6.1–8.1)

## 2018-08-01 LAB — CBC WITH DIFFERENTIAL/PLATELET
BASOS ABS: 10 {cells}/uL (ref 0–200)
Basophils Relative: 0.3 %
Eosinophils Absolute: 69 cells/uL (ref 15–500)
Eosinophils Relative: 2.1 %
HCT: 28.5 % — ABNORMAL LOW (ref 38.5–50.0)
Hemoglobin: 9.8 g/dL — ABNORMAL LOW (ref 13.2–17.1)
Lymphs Abs: 818 cells/uL — ABNORMAL LOW (ref 850–3900)
MCH: 31.8 pg (ref 27.0–33.0)
MCHC: 34.4 g/dL (ref 32.0–36.0)
MCV: 92.5 fL (ref 80.0–100.0)
MONOS PCT: 8.3 %
MPV: 11.3 fL (ref 7.5–12.5)
NEUTROS PCT: 64.5 %
Neutro Abs: 2129 cells/uL (ref 1500–7800)
PLATELETS: 55 10*3/uL — AB (ref 140–400)
RBC: 3.08 10*6/uL — AB (ref 4.20–5.80)
RDW: 13 % (ref 11.0–15.0)
TOTAL LYMPHOCYTE: 24.8 %
WBC mixed population: 274 cells/uL (ref 200–950)
WBC: 3.3 10*3/uL — AB (ref 3.8–10.8)

## 2018-08-01 LAB — AMMONIA: Ammonia: 100 umol/L — ABNORMAL HIGH (ref <=72)

## 2018-08-02 ENCOUNTER — Other Ambulatory Visit (INDEPENDENT_AMBULATORY_CARE_PROVIDER_SITE_OTHER): Payer: Self-pay | Admitting: Internal Medicine

## 2018-08-02 ENCOUNTER — Other Ambulatory Visit (INDEPENDENT_AMBULATORY_CARE_PROVIDER_SITE_OTHER): Payer: Self-pay | Admitting: *Deleted

## 2018-08-02 DIAGNOSIS — K7460 Unspecified cirrhosis of liver: Secondary | ICD-10-CM

## 2018-08-09 ENCOUNTER — Other Ambulatory Visit (INDEPENDENT_AMBULATORY_CARE_PROVIDER_SITE_OTHER): Payer: Self-pay | Admitting: *Deleted

## 2018-08-09 ENCOUNTER — Encounter (INDEPENDENT_AMBULATORY_CARE_PROVIDER_SITE_OTHER): Payer: Self-pay | Admitting: *Deleted

## 2018-08-09 DIAGNOSIS — K760 Fatty (change of) liver, not elsewhere classified: Secondary | ICD-10-CM

## 2018-08-14 DIAGNOSIS — K7460 Unspecified cirrhosis of liver: Secondary | ICD-10-CM | POA: Diagnosis not present

## 2018-08-14 DIAGNOSIS — K7290 Hepatic failure, unspecified without coma: Secondary | ICD-10-CM | POA: Diagnosis not present

## 2018-08-14 DIAGNOSIS — K7581 Nonalcoholic steatohepatitis (NASH): Secondary | ICD-10-CM | POA: Diagnosis not present

## 2018-08-22 ENCOUNTER — Emergency Department (HOSPITAL_COMMUNITY): Payer: Medicare Other

## 2018-08-22 ENCOUNTER — Other Ambulatory Visit: Payer: Self-pay

## 2018-08-22 ENCOUNTER — Observation Stay (HOSPITAL_COMMUNITY)
Admission: EM | Admit: 2018-08-22 | Discharge: 2018-08-23 | Disposition: A | Discharge status: Home / Self Care | Payer: Medicare Other | Attending: Internal Medicine | Admitting: Internal Medicine

## 2018-08-22 ENCOUNTER — Encounter (HOSPITAL_COMMUNITY): Payer: Self-pay | Admitting: Emergency Medicine

## 2018-08-22 DIAGNOSIS — K7290 Hepatic failure, unspecified without coma: Principal | ICD-10-CM

## 2018-08-22 DIAGNOSIS — K7682 Hepatic encephalopathy: Secondary | ICD-10-CM | POA: Diagnosis present

## 2018-08-22 DIAGNOSIS — R9431 Abnormal electrocardiogram [ECG] [EKG]: Secondary | ICD-10-CM | POA: Diagnosis not present

## 2018-08-22 DIAGNOSIS — E039 Hypothyroidism, unspecified: Secondary | ICD-10-CM | POA: Insufficient documentation

## 2018-08-22 DIAGNOSIS — R4182 Altered mental status, unspecified: Secondary | ICD-10-CM | POA: Diagnosis present

## 2018-08-22 DIAGNOSIS — R531 Weakness: Secondary | ICD-10-CM | POA: Diagnosis not present

## 2018-08-22 DIAGNOSIS — Z79899 Other long term (current) drug therapy: Secondary | ICD-10-CM | POA: Insufficient documentation

## 2018-08-22 DIAGNOSIS — Z87891 Personal history of nicotine dependence: Secondary | ICD-10-CM | POA: Diagnosis not present

## 2018-08-22 DIAGNOSIS — E119 Type 2 diabetes mellitus without complications: Secondary | ICD-10-CM | POA: Insufficient documentation

## 2018-08-22 DIAGNOSIS — Z7984 Long term (current) use of oral hypoglycemic drugs: Secondary | ICD-10-CM | POA: Insufficient documentation

## 2018-08-22 LAB — CBC WITH DIFFERENTIAL/PLATELET
Abs Immature Granulocytes: 0.01 10*3/uL (ref 0.00–0.07)
Basophils Absolute: 0 10*3/uL (ref 0.0–0.1)
Basophils Relative: 1 %
Eosinophils Absolute: 0.1 10*3/uL (ref 0.0–0.5)
Eosinophils Relative: 2 %
HEMATOCRIT: 30.7 % — AB (ref 39.0–52.0)
Hemoglobin: 10.4 g/dL — ABNORMAL LOW (ref 13.0–17.0)
Immature Granulocytes: 0 %
Lymphocytes Relative: 16 %
Lymphs Abs: 0.5 10*3/uL — ABNORMAL LOW (ref 0.7–4.0)
MCH: 31.6 pg (ref 26.0–34.0)
MCHC: 33.9 g/dL (ref 30.0–36.0)
MCV: 93.3 fL (ref 80.0–100.0)
Monocytes Absolute: 0.2 10*3/uL (ref 0.1–1.0)
Monocytes Relative: 8 %
Neutro Abs: 2.2 10*3/uL (ref 1.7–7.7)
Neutrophils Relative %: 73 %
Platelets: 49 10*3/uL — ABNORMAL LOW (ref 150–400)
RBC: 3.29 MIL/uL — ABNORMAL LOW (ref 4.22–5.81)
RDW: 14.2 % (ref 11.5–15.5)
WBC: 3 10*3/uL — ABNORMAL LOW (ref 4.0–10.5)
nRBC: 0 % (ref 0.0–0.2)

## 2018-08-22 LAB — URINALYSIS, COMPLETE (UACMP) WITH MICROSCOPIC
BACTERIA UA: NONE SEEN
Bilirubin Urine: NEGATIVE
Glucose, UA: NEGATIVE mg/dL
Hgb urine dipstick: NEGATIVE
Ketones, ur: NEGATIVE mg/dL
Leukocytes, UA: NEGATIVE
Nitrite: NEGATIVE
Protein, ur: NEGATIVE mg/dL
Specific Gravity, Urine: 1.013 (ref 1.005–1.030)
pH: 7 (ref 5.0–8.0)

## 2018-08-22 LAB — COMPREHENSIVE METABOLIC PANEL
ALT: 27 U/L (ref 0–44)
AST: 47 U/L — ABNORMAL HIGH (ref 15–41)
Albumin: 3.6 g/dL (ref 3.5–5.0)
Alkaline Phosphatase: 116 U/L (ref 38–126)
Anion gap: 7 (ref 5–15)
BUN: 14 mg/dL (ref 8–23)
CO2: 21 mmol/L — ABNORMAL LOW (ref 22–32)
Calcium: 10.7 mg/dL — ABNORMAL HIGH (ref 8.9–10.3)
Chloride: 110 mmol/L (ref 98–111)
Creatinine, Ser: 0.98 mg/dL (ref 0.61–1.24)
GFR calc non Af Amer: >60 mL/min (ref >60)
Glucose, Bld: 142 mg/dL — ABNORMAL HIGH (ref 70–99)
Potassium: 3.8 mmol/L (ref 3.5–5.1)
Sodium: 138 mmol/L (ref 135–145)
Total Bilirubin: 2.1 mg/dL — ABNORMAL HIGH (ref 0.3–1.2)
Total Protein: 6.6 g/dL (ref 6.5–8.1)

## 2018-08-22 LAB — I-STAT CG4 LACTIC ACID, ED: Lactic Acid, Venous: 1.4 mmol/L (ref 0.5–1.9)

## 2018-08-22 LAB — LIPASE, BLOOD: Lipase: 53 U/L — ABNORMAL HIGH (ref 11–51)

## 2018-08-22 LAB — CBG MONITORING, ED
GLUCOSE-CAPILLARY: 122 mg/dL — AB (ref 70–99)
Glucose-Capillary: 128 mg/dL — ABNORMAL HIGH (ref 70–99)

## 2018-08-22 LAB — GLUCOSE, CAPILLARY: Glucose-Capillary: 144 mg/dL — ABNORMAL HIGH (ref 70–99)

## 2018-08-22 LAB — ETHANOL: Alcohol, Ethyl (B): <10 mg/dL (ref <10)

## 2018-08-22 LAB — AMMONIA: AMMONIA: 83 umol/L — AB (ref 9–35)

## 2018-08-22 LAB — TSH: TSH: 0.304 u[IU]/mL — ABNORMAL LOW (ref 0.350–4.500)

## 2018-08-22 MED ORDER — ONDANSETRON HCL 4 MG/2ML IJ SOLN: 4.0000 mg | Freq: Four times a day (QID) | INTRAMUSCULAR | Status: DC | PRN

## 2018-08-22 MED ORDER — ACETAMINOPHEN 650 MG RE SUPP: 650.0000 mg | Freq: Four times a day (QID) | RECTAL | Status: DC | PRN

## 2018-08-22 MED ORDER — RIFAXIMIN 550 MG PO TABS
550.0000 mg | ORAL_TABLET | Freq: Two times a day (BID) | ORAL | Status: DC
  Administered 2018-08-22 – 2018-08-23 (×2): 550 mg via ORAL
  Filled 2018-08-22 (×4): qty 1

## 2018-08-22 MED ORDER — LEVOTHYROXINE SODIUM 88 MCG PO TABS
88.0000 ug | ORAL_TABLET | Freq: Every day | ORAL | Status: DC
  Administered 2018-08-23: 88 ug via ORAL
  Filled 2018-08-22: qty 1

## 2018-08-22 MED ORDER — LISINOPRIL-HYDROCHLOROTHIAZIDE 20-25 MG PO TABS: 1.0000 | ORAL_TABLET | Freq: Every evening | ORAL | Status: DC

## 2018-08-22 MED ORDER — PANTOPRAZOLE SODIUM 40 MG PO TBEC
40.0000 mg | DELAYED_RELEASE_TABLET | Freq: Two times a day (BID) | ORAL | Status: DC
  Administered 2018-08-22 – 2018-08-23 (×2): 40 mg via ORAL
  Filled 2018-08-22 (×2): qty 1

## 2018-08-22 MED ORDER — LISINOPRIL 10 MG PO TABS
20.0000 mg | ORAL_TABLET | Freq: Every day | ORAL | Status: DC
  Administered 2018-08-22 – 2018-08-23 (×2): 20 mg via ORAL
  Filled 2018-08-22 (×2): qty 2

## 2018-08-22 MED ORDER — ACETAMINOPHEN 325 MG PO TABS: 650.0000 mg | ORAL_TABLET | Freq: Four times a day (QID) | ORAL | Status: DC | PRN

## 2018-08-22 MED ORDER — SODIUM CHLORIDE 0.9 % IV BOLUS
1000.0000 mL | Freq: Once | INTRAVENOUS | Status: AC
  Administered 2018-08-22: 1000 mL via INTRAVENOUS

## 2018-08-22 MED ORDER — SODIUM CHLORIDE 0.9 % IV SOLN
INTRAVENOUS | Status: DC
  Administered 2018-08-22: 13:00:00 via INTRAVENOUS

## 2018-08-22 MED ORDER — FERROUS SULFATE 325 (65 FE) MG PO TABS
325.0000 mg | ORAL_TABLET | Freq: Two times a day (BID) | ORAL | Status: DC
  Administered 2018-08-22 – 2018-08-23 (×2): 325 mg via ORAL
  Filled 2018-08-22 (×2): qty 1

## 2018-08-22 MED ORDER — INSULIN ASPART 100 UNIT/ML ~~LOC~~ SOLN: 0.0000 [IU] | Freq: Three times a day (TID) | SUBCUTANEOUS | Status: DC

## 2018-08-22 MED ORDER — HYDROCHLOROTHIAZIDE 25 MG PO TABS
25.0000 mg | ORAL_TABLET | Freq: Every day | ORAL | Status: DC
  Administered 2018-08-22 – 2018-08-23 (×2): 25 mg via ORAL
  Filled 2018-08-22 (×2): qty 1

## 2018-08-22 MED ORDER — ONDANSETRON HCL 4 MG PO TABS: 4.0000 mg | ORAL_TABLET | Freq: Four times a day (QID) | ORAL | Status: DC | PRN

## 2018-08-22 MED ORDER — LACTULOSE 10 GM/15ML PO SOLN
30.0000 g | Freq: Four times a day (QID) | ORAL | Status: DC
  Administered 2018-08-22 – 2018-08-23 (×4): 30 g via ORAL
  Filled 2018-08-22 (×4): qty 60

## 2018-08-22 NOTE — ED Triage Notes (Signed)
Per wife,  Called EMS , they assisted pt to car, family brought pt to ED. Assisted out of car. Pt confused. Confusion upon waking this morning.  HX of non alcoholic cirrhosis

## 2018-08-22 NOTE — H&P (Signed)
History and Physical    Jeremy Chavez BTD:[id number] DOB: Jun 23, 1951 DOA: 08/22/2018  PCP: Glenda Chroman, MD   Patient coming from: Home  Chief Complaint: Confusion  HPI: Jeremy Chavez is a 67 y.o. male with medical history significant for nonalcoholic liver cirrhosis, hypothyroidism, DM, brought to the [facility] with complaints of confusion upon waking up this morning.  Patient not responding to questions, and sleeping most of the day.  Family reports daily compliance with lactulose 4 times daily, with an average 3-4 bowel movements daily.  Patient had 2 or 3 bowel movements yesterday.  No fever or chills, no abdominal pain, no vomiting or loose stools, no dysuria frequency, no headaches or neck pain, cough or difficulty breathing, no dark stools. Patient follows with gastroenterologist Dr. Laural Golden, also with Dr. Loletha Grayer at [facility] clinic.  He is not on the transplant list.  Family reports patient has been on rifaximin-reports from clinic, before but insurance would not cover it.  ED Course: Vitals.  Elevated ammonia 83, but more significant elevations in the past to 170.  Cytopenia with low WBC 3, anemia 10.4 platelets 49, all consistent with prior.  Normal lactic acid 1.4.  UA clean.  Chest x-ray no acute abnormality.  EKG no ST or T wave abnormalities.  Sinus rhythm.  GI Dr. Melony Overly consulted in ED recommended starting patient on rifaximin and resuming lactulose.  Review of Systems: As per HPI other systems reviewed and negative  Past Medical History:  Diagnosis Date  . Diabetes ([place])   . Hypothyroid   . NAFLD (nonalcoholic fatty liver disease)     Past Surgical History:  Procedure Laterality Date  . carpal tunnel rt hand    . ESOPHAGOGASTRODUODENOSCOPY N/A 02/10/2018   Procedure: ESOPHAGOGASTRODUODENOSCOPY (EGD);  Surgeon: Rogene Houston, MD;  [facility];  Service: Endoscopy;  Laterality: N/A;  3:64  . umblical hernia repair       reports that he has quit smoking. He has  never used smokeless tobacco. He reports that he does not drink alcohol or use drugs.  No Known Allergies  Family History  Problem Relation Age of Onset  . Liver cancer Father     Prior to Admission medications   Medication Sig Start Date End Date Taking? Authorizing Provider  ferrous sulfate 325 (65 FE) MG tablet Take 325 mg by mouth 2 (two) times daily.   Yes [provider]  lactulose (CHRONULAC) 10 GM/15ML solution Take 20 g by mouth 4 (four) times daily.    Yes [provider]  levothyroxine (SYNTHROID, LEVOTHROID) 125 MCG tablet Take 88 mcg by mouth daily before breakfast.    Yes [provider]  lisinopril-hydrochlorothiazide (PRINZIDE,ZESTORETIC) 20-25 MG tablet Take 1 tablet by mouth every evening.    Yes [provider]  metFORMIN (GLUCOPHAGE) 1000 MG tablet Take 500 mg by mouth daily with breakfast.    Yes [provider]  pantoprazole (PROTONIX) 40 MG tablet Take 1 tablet (40 mg total) by mouth 2 (two) times daily. 08/02/18  Yes Setzer, Rona Ravens, NP  pantoprazole (PROTONIX) 40 MG tablet Take 1 tablet (40 mg total) by mouth 2 (two) times daily before a meal. Patient not taking: Reported on 08/22/2018 04/03/18   Rogene Houston, MD    Physical Exam: Vitals:   08/22/18 1430 08/22/18 1500 08/22/18 1530 08/22/18 1600  BP: 122/61 112/73 131/76 128/71  Pulse: 87 71 83 84  Resp: 15 14 13 15   Temp:      TempSrc:  SpO2: 97% 98% 98% 96%  Weight:      Height:        Constitutional: NAD, calm, comfortable, awake and alert answering questions, making jokes Vitals:   08/22/18 1430 08/22/18 1500 08/22/18 1530 08/22/18 1600  BP: 122/61 112/73 131/76 128/71  Pulse: 87 71 83 84  Resp: 15 14 13 15   Temp:      TempSrc:      SpO2: 97% 98% 98% 96%  Weight:      Height:       Eyes: PERRL, lids and conjunctivae normal ENMT: Mucous membranes are moist. Posterior pharynx clear of any exudate or lesions. Neck: normal, supple, no masses,  no thyromegaly Respiratory: clear to auscultation bilaterally, no wheezing, no crackles. Normal respiratory effort. No accessory muscle use.  Cardiovascular: Regular rate and rhythm, no murmurs / rubs / gallops. No extremity edema. 2+ pedal pulses. No carotid bruits.  Abdomen: Full- at baseline per family, no tenderness, no masses palpated. No hepatosplenomegaly. Bowel sounds positive.  Asterixis present Musculoskeletal: no clubbing / cyanosis. No joint deformity upper and lower extremities. Good ROM, no contractures. Normal muscle tone.  Skin: no rashes, lesions, ulcers. No induration Neurologic: CN 2-12 grossly intact. Sensation intact, DTR normal. Strength 5/5 in all 4.  Psychiatric: Normal judgment and insight. Alert and oriented x 3. Normal mood.   Labs on Admission: I have personally reviewed following labs and imaging studies  CBC: Recent Labs  Lab 08/22/18 1159  WBC 3.0*  NEUTROABS 2.2  HGB 10.4*  HCT 30.7*  MCV 93.3  PLT 49*   Basic Metabolic Panel: Recent Labs  Lab 08/22/18 1159  NA 138  K 3.8  CL 110  CO2 21*  GLUCOSE 142*  BUN 14  CREATININE 0.98  CALCIUM 10.7*   Liver Function Tests: Recent Labs  Lab 08/22/18 1159  AST 47*  ALT 27  ALKPHOS 116  BILITOT 2.1*  PROT 6.6  ALBUMIN 3.6   Recent Labs  Lab 08/22/18 1159  LIPASE 53*   Recent Labs  Lab 08/22/18 1329  AMMONIA 83*   CBG: Recent Labs  Lab 08/22/18 1306  GLUCAP 128*   Urine analysis:    Component Value Date/Time   COLORURINE YELLOW 08/22/2018 [place] 08/22/2018 1420   LABSPEC 1.013 08/22/2018 1420   PHURINE 7.0 08/22/2018 1420   GLUCOSEU NEGATIVE 08/22/2018 1420   HGBUR NEGATIVE 08/22/2018 1420   BILIRUBINUR NEGATIVE 08/22/2018 1420   KETONESUR NEGATIVE 08/22/2018 1420   PROTEINUR NEGATIVE 08/22/2018 1420   NITRITE NEGATIVE 08/22/2018 1420   LEUKOCYTESUR NEGATIVE 08/22/2018 1420    Radiological Exams on Admission: Dg Chest Port 1 View  Result Date:  08/22/2018 CLINICAL DATA:  Weakness EXAM: PORTABLE CHEST 1 VIEW COMPARISON:  10/03/2017 FINDINGS: Cardiac shadow is at the upper limits of normal but accentuated by the portable technique. The lungs are hypoinflated but clear. Degenerative changes of the right shoulder joint are noted. No acute bony abnormality is seen. IMPRESSION: No acute abnormality noted. Electronically Signed   By: Inez Catalina M.D.   On: 08/22/2018 12:33    EKG: Independently reviewed.  Sinus rhythm no ST or T wave abnormalities.  QTc 473.  Rate 75  Assessment/Plan Active Problems:   Hepatic encephalopathy (HCC)   Hepatic encephalopathy-increased sleepiness, confusion, ammonia level 83 but more significant elevations in the past to 170. Asterexis present.  Infectious etiology identified.  Hemoglobin stable. Gi consulted will see in A.m -Follow-up GI recommendations in a.m. -Continue lactulose 61m  QID, start rifaximin 550 twice daily per GI recs -Hold off on head CT at this time as his mental status is improving, patient answering questions appropriately ( head Ct 10/2017- No acute abnormality) - TSH. Folate, B 12 check -Case manager consult for assistance with medication rifaximin  Hypothyroidism - TSH -Home Synthroid.  Nonalcoholic liver cirrhosis with complications of esophageal varices, portal hypertensive gastropathy, pancytopenia-WBC 3, platelets 49, hemoglobin 10.4.  Likely secondary to liver cirrhosis, and splenomegaly. Follows with Dr. Laural Golden and Dr Loletha Grayer at [place].   HIV as part of routine health screening  DVT prophylaxis: Scds Code Status: Full Family Communication: Spouse and daughter at bedside Disposition Plan: 1- 2 days Consults called: GI Admission status: Obs, Med-surg   Bethena Roys MD [facility] Pager [phone] From 3PM-11PM.  Otherwise please contact night-coverage [email or web address] Password [facility]  08/22/2018, 7:42 PM

## 2018-08-22 NOTE — ED Provider Notes (Signed)
[facility] EMERGENCY DEPARTMENT Provider Note   CSN: [id number] Arrival date & time: 08/22/18  1108     History   Chief Complaint Chief Complaint  Patient presents with  . Altered Mental Status    HPI Jeremy Chavez is a 67 y.o. male.  HPI Presents with his wife and daughter who provide the HPI. Level 5 caveat secondary to altered mental status. Family notes the patient has been weaker than usual recently, but until today was in his usual state of health, essentially. Today, the patient awoke, and has been less interactive, disoriented, minimally verbal. He has been taking all medication as directed, though he did not receive today's lactulose dose. He also had reduction in his thyroid supplement medication 1 week ago. Patient has a known history of hepatic disease, is [facility] a [facility], is not currently listed for transplant.  Past Medical History:  Diagnosis Date  . Diabetes ([place])   . Hypothyroid   . NAFLD (nonalcoholic fatty liver disease)     Patient Active Problem List   Diagnosis Date Noted  . Cirrhosis of liver without ascites ([place]) 01/11/2018  . Hypothyroid   . Diabetes ([place])   . NAFLD (nonalcoholic fatty liver disease)     Past Surgical History:  Procedure Laterality Date  . carpal tunnel rt hand    . ESOPHAGOGASTRODUODENOSCOPY N/A 02/10/2018   Procedure: ESOPHAGOGASTRODUODENOSCOPY (EGD);  Surgeon: Rogene Houston, MD;  Location[facility];  Service: Endoscopy;  Laterality: N/A;  5:05  . umblical hernia repair          Home Medications    Prior to Admission medications   Medication Sig Start Date End Date Taking? Authorizing Provider  ferrous sulfate 325 (65 FE) MG tablet Take 325 mg by mouth 2 (two) times daily.   Yes [provider]  lactulose (CHRONULAC) 10 GM/15ML solution Take 20 g by mouth 4 (four) times daily.    Yes [provider]  levothyroxine (SYNTHROID, LEVOTHROID) 125 MCG tablet Take 88 mcg by mouth  daily before breakfast.    Yes [provider]  lisinopril-hydrochlorothiazide (PRINZIDE,ZESTORETIC) 20-25 MG tablet Take 1 tablet by mouth every evening.    Yes [provider]  metFORMIN (GLUCOPHAGE) 1000 MG tablet Take 500 mg by mouth daily with breakfast.    Yes [provider]  pantoprazole (PROTONIX) 40 MG tablet Take 1 tablet (40 mg total) by mouth 2 (two) times daily. 08/02/18  Yes Setzer, Rona Ravens, NP  pantoprazole (PROTONIX) 40 MG tablet Take 1 tablet (40 mg total) by mouth 2 (two) times daily before a meal. Patient not taking: Reported on 08/22/2018 04/03/18   Rogene Houston, MD    Family History Family History  Problem Relation Age of Onset  . Liver cancer Father     Social History Social History   Tobacco Use  . Smoking status: Former Research scientist (life sciences)  . Smokeless tobacco: Never Used  Substance Use Topics  . Alcohol use: No    Frequency: Never  . Drug use: No     Allergies   Patient has no known allergies.   Review of Systems Review of Systems  Unable to perform ROS: Acuity of condition     Physical Exam Updated Vital Signs BP 112/73   Pulse 71   Temp 98 F (36.7 C) (Oral)   Resp 14   Ht 6' 1"  (1.854 m)   Wt 102.5 kg   SpO2 98%   BMI 29.82 kg/m   Physical  Exam  Constitutional: He appears listless. He has a sickly appearance.  HENT:  Head: Normocephalic and atraumatic.  Eyes: Conjunctivae and EOM are normal.  Cardiovascular: Normal rate and regular rhythm.  Pulmonary/Chest: Effort normal. No stridor. No respiratory distress.  Abdominal: He exhibits no distension.  Musculoskeletal: He exhibits no edema.  Neurological: He appears listless. He displays atrophy.  Patient awakens to minimal stimuli, offers brief verbal responses, and consistently appropriate.   Skin: Skin is warm and dry.  Psychiatric: His speech is delayed. He is withdrawn. Cognition and memory are impaired.  Nursing note and vitals reviewed.    ED Treatments  / Results  Labs (all labs ordered are listed, but only abnormal results are displayed) Labs Reviewed  COMPREHENSIVE METABOLIC PANEL - Abnormal; Notable for the following components:      Result Value   CO2 21 (*)    Glucose, Bld 142 (*)    Calcium 10.7 (*)    AST 47 (*)    Total Bilirubin 2.1 (*)    All other components within normal limits  CBC WITH DIFFERENTIAL/PLATELET - Abnormal; Notable for the following components:   WBC 3.0 (*)    RBC 3.29 (*)    Hemoglobin 10.4 (*)    HCT 30.7 (*)    Platelets 49 (*)    Lymphs Abs 0.5 (*)    All other components within normal limits  AMMONIA - Abnormal; Notable for the following components:   Ammonia 83 (*)    All other components within normal limits  LIPASE, BLOOD - Abnormal; Notable for the following components:   Lipase 53 (*)    All other components within normal limits  CBG MONITORING, ED - Abnormal; Notable for the following components:   Glucose-Capillary 128 (*)    All other components within normal limits  URINALYSIS, COMPLETE (UACMP) WITH MICROSCOPIC  ETHANOL  I-STAT CG4 LACTIC ACID, ED    EKG EKG Interpretation  Date/Time:  Tuesday August 22 2018 11:34:59 EST Ventricular Rate:  75 PR Interval:    QRS Duration: 87 QT Interval:  423 QTC Calculation: 473 R Axis:   11 Text Interpretation:  Sinus rhythm Anterior infarct, old Low voltage QRS Artifact Abnormal ekg Confirmed by Carmin Muskrat [phone]) on 08/22/2018 12:36:36 PM   Radiology Dg Chest Port 1 View  Result Date: 08/22/2018 CLINICAL DATA:  Weakness EXAM: PORTABLE CHEST 1 VIEW COMPARISON:  10/03/2017 FINDINGS: Cardiac shadow is at the upper limits of normal but accentuated by the portable technique. The lungs are hypoinflated but clear. Degenerative changes of the right shoulder joint are noted. No acute bony abnormality is seen. IMPRESSION: No acute abnormality noted. Electronically Signed   By: Inez Catalina M.D.   On: 08/22/2018 12:33     Procedures Procedures (including critical care time)  Medications Ordered in ED Medications  sodium chloride 0.9 % bolus 1,000 mL (0 mLs Intravenous Stopped 08/22/18 1411)    And  0.9 %  sodium chloride infusion ( Intravenous Bolus from Bag 08/22/18 1300)  rifaximin (XIFAXAN) tablet 550 mg (has no administration in time range)  lactulose (CHRONULAC) 10 GM/15ML solution 30 g (has no administration in time range)     Initial Impression / Assessment and Plan / ED Course  I have reviewed the triage vital signs and the nursing notes.  Pertinent labs & imaging results that were available during my care of the patient were reviewed by me and considered in my medical decision making (see chart for details).  3:29 PM On repeat exam the patient is in similar condition, awakens to stimuli, otherwise is listless. I discussed the patient's case with his gastroenterologist, and reviewed the labs, subsequently discussed with the family. Patient found to have hyperammonemia, as well as hyperbilirubinemia, beyond his most recent values. With concern for hepatic encephalopathy, the patient will receive both lactulose and xifaxin, as well as ongoing IVF. Dr. Laural Golden will see the patient in the AM, and he will be admitted to the hospitalist service for further monitoring and management.   Final Clinical Impressions(s) / ED Diagnoses  Hepatic encephalopathy   Carmin Muskrat, MD 08/22/18 [phone]

## 2018-08-23 DIAGNOSIS — K7290 Hepatic failure, unspecified without coma: Secondary | ICD-10-CM | POA: Diagnosis not present

## 2018-08-23 DIAGNOSIS — K7581 Nonalcoholic steatohepatitis (NASH): Secondary | ICD-10-CM | POA: Diagnosis not present

## 2018-08-23 LAB — VITAMIN B12: Vitamin B-12: 188 pg/mL (ref 180–914)

## 2018-08-23 LAB — CBC
HCT: 27.2 % — ABNORMAL LOW (ref 39.0–52.0)
Hemoglobin: 9 g/dL — ABNORMAL LOW (ref 13.0–17.0)
MCH: 31.3 pg (ref 26.0–34.0)
MCHC: 33.1 g/dL (ref 30.0–36.0)
MCV: 94.4 fL (ref 80.0–100.0)
Platelets: 45 10*3/uL — ABNORMAL LOW (ref 150–400)
RBC: 2.88 MIL/uL — ABNORMAL LOW (ref 4.22–5.81)
RDW: 14.2 % (ref 11.5–15.5)
WBC: 3 10*3/uL — ABNORMAL LOW (ref 4.0–10.5)
nRBC: 0 % (ref 0.0–0.2)

## 2018-08-23 LAB — GLUCOSE, CAPILLARY
GLUCOSE-CAPILLARY: 92 mg/dL (ref 70–99)
Glucose-Capillary: 119 mg/dL — ABNORMAL HIGH (ref 70–99)
Glucose-Capillary: 92 mg/dL (ref 70–99)

## 2018-08-23 LAB — ALBUMIN: Albumin: 3 g/dL — ABNORMAL LOW (ref 3.5–5.0)

## 2018-08-23 LAB — CALCIUM: CALCIUM: 10.6 mg/dL — AB (ref 8.9–10.3)

## 2018-08-23 LAB — AMMONIA: Ammonia: 91 umol/L — ABNORMAL HIGH (ref 9–35)

## 2018-08-23 MED ORDER — BISACODYL 10 MG RE SUPP: 10.0000 mg | RECTAL | 0 refills | Status: DC | PRN

## 2018-08-23 MED ORDER — LACTULOSE 10 GM/15ML PO SOLN: 30.0000 g | Freq: Four times a day (QID) | ORAL | 0 refills | Status: DC

## 2018-08-23 MED ORDER — RIFAXIMIN 550 MG PO TABS: 550.0000 mg | ORAL_TABLET | Freq: Every morning | ORAL | 0 refills | Status: DC

## 2018-08-23 MED ORDER — LACTULOSE 10 GM/15ML PO SOLN: 30.0000 g | Freq: Four times a day (QID) | ORAL | 1 refills | Status: AC

## 2018-08-23 NOTE — Care Management Obs Status (Signed)
[facility] NOTIFICATION   Patient Details  Name: Jeremy Chavez MRN: [id number] Date of Birth: 24-Apr-1951   Medicare Observation Status Notification Given:  Yes    Shelda Altes 08/23/2018, 1:18 PM

## 2018-08-23 NOTE — Discharge Summary (Signed)
Physician Discharge Summary  Jeremy Chavez JFH:[id number] DOB: [birth year] DOA: 08/22/2018  PCP: Glenda Chroman, MD  Admit date: 08/22/2018  Discharge date: 08/23/2018  Admitted From:Home  Disposition:  Home  Recommendations for Outpatient Follow-up:  1. Follow up with PCP in 1-2 weeks 2. Follow-up with GI Dr. Laural Golden as noted by end of December 3. Remain on higher dose of lactulose as tolerated 4. Rifaximin for 5 days recommended by GI, but patient unwilling to accept the prescription due to cost concerns as it would be over $200  Home Health: None  Equipment/Devices: None  Discharge Condition: Stable  CODE STATUS: Full  Diet recommendation: Heart Healthy/carb modified  Brief/Interim Summary: Per HPI from Dr. Denton Brick: Jeremy Chavez is a 67 y.o. male with medical history significant for nonalcoholic liver cirrhosis, hypothyroidism, DM, brought to the [facility] with complaints of confusion upon waking up this morning.  Patient not responding to questions, and sleeping most of the day.  Family reports daily compliance with lactulose 4 times daily, with an average 3-4 bowel movements daily.  Patient had 2 or 3 bowel movements yesterday.  No fever or chills, no abdominal pain, no vomiting or loose stools, no dysuria frequency, no headaches or neck pain, cough or difficulty breathing, no dark stools. Patient follows with gastroenterologist Dr. Laural Golden, also with Dr. Loletha Grayer at [facility] clinic.  He is not on the transplant list.  Family reports patient has been on rifaximin-reports from clinic, before but insurance would not cover it.  Patient was admitted for hepatic encephalopathy with elevated ammonia levels noted.  His ammonia levels have stabilized and his mentation has improved considerably overnight.  He has been seen by GI with recommendations to remain on a higher dose of lactulose for now until he could have further follow up at the clinic by the end of the month.  At that time, there will be  consideration for addition of Rifaximin. No other acute events or concerns noted during this brief hospital admission.  He will follow-up with GI as noted above.  Discharge Diagnoses:  Active Problems:   Hepatic encephalopathy ([place])  Principal discharge diagnosis: Acute hepatic encephalopathy.  Discharge Instructions  Discharge Instructions    Diet - low sodium heart healthy   Complete by:  As directed    Diet - low sodium heart healthy   Complete by:  As directed    Increase activity slowly   Complete by:  As directed    Increase activity slowly   Complete by:  As directed      Allergies as of 08/23/2018   No Known Allergies     Medication List    TAKE these medications   bisacodyl 10 MG suppository Commonly known as:  DULCOLAX Place 1 suppository (10 mg total) rectally as needed for moderate constipation.   ferrous sulfate 325 (65 FE) MG tablet Take 325 mg by mouth 2 (two) times daily.   lactulose 10 GM/15ML solution Commonly known as:  CHRONULAC Take 45 mLs (30 g total) by mouth 4 (four) times daily. What changed:  how much to take   levothyroxine 125 MCG tablet Commonly known as:  SYNTHROID, LEVOTHROID Take 88 mcg by mouth daily before breakfast.   lisinopril-hydrochlorothiazide 20-25 MG tablet Commonly known as:  PRINZIDE,ZESTORETIC Take 1 tablet by mouth every evening.   metFORMIN 1000 MG tablet Commonly known as:  GLUCOPHAGE Take 500 mg by mouth daily with breakfast.   pantoprazole 40 MG tablet Commonly known as:  PROTONIX Take 1 tablet (40  mg total) by mouth 2 (two) times daily before a meal.   pantoprazole 40 MG tablet Commonly known as:  PROTONIX Take 1 tablet (40 mg total) by mouth 2 (two) times daily.      Follow-up Information    Vyas, Dhruv B, MD Follow up in 1 week(s).   Specialty:  Internal Medicine Contact information: [place] [address] [phone]        Rogene Houston, MD Follow up in 2 week(s).   Specialty:   Gastroenterology Contact information: [place], [address] [address] [phone]          No Known Allergies  Consultations:  None   Procedures/Studies: Dg Chest Port 1 View  Result Date: 08/22/2018 CLINICAL DATA:  Weakness EXAM: PORTABLE CHEST 1 VIEW COMPARISON:  10/03/2017 FINDINGS: Cardiac shadow is at the upper limits of normal but accentuated by the portable technique. The lungs are hypoinflated but clear. Degenerative changes of the right shoulder joint are noted. No acute bony abnormality is seen. IMPRESSION: No acute abnormality noted. Electronically Signed   By: Inez Catalina M.D.   On: 08/22/2018 12:33    Discharge Exam: Vitals:   08/23/18 0528 08/23/18 1545  BP: (!) 110/58 127/64  Pulse: 76 80  Resp:    Temp: 98.5 F (36.9 C) 98 F (36.7 C)  SpO2: 99% 99%   Vitals:   08/22/18 1835 08/22/18 2123 08/23/18 0528 08/23/18 1545  BP: (!) 152/79 121/63 (!) 110/58 127/64  Pulse: 93 89 76 80  Resp: 16     Temp: 98.7 F (37.1 C) 98 F (36.7 C) 98.5 F (36.9 C) 98 F (36.7 C)  TempSrc: Oral Oral Oral Oral  SpO2: 99% 100% 99% 99%  Weight: 100.1 kg     Height: 6' 1"  (1.854 m)       General: Pt is alert, awake, not in acute distress Cardiovascular: RRR, S1/S2 +, no rubs, no gallops Respiratory: CTA bilaterally, no wheezing, no rhonchi Abdominal: Soft, NT, ND, bowel sounds + Extremities: no edema, no cyanosis    The results of significant diagnostics from this hospitalization (including imaging, microbiology, ancillary and laboratory) are listed below for reference.     Microbiology: No results found for this or any previous visit (from the past 240 hour(s)).   Labs: BNP (last 3 results) No results for input(s): BNP in the last 8760 hours. Basic Metabolic Panel: Recent Labs  Lab 08/22/18 1159  NA 138  K 3.8  CL 110  CO2 21*  GLUCOSE 142*  BUN 14  CREATININE 0.98  CALCIUM 10.7*   Liver Function Tests: Recent Labs  Lab  08/22/18 1159  AST 47*  ALT 27  ALKPHOS 116  BILITOT 2.1*  PROT 6.6  ALBUMIN 3.6   Recent Labs  Lab 08/22/18 1159  LIPASE 53*   Recent Labs  Lab 08/22/18 1329 08/23/18 1227  AMMONIA 83* 91*   CBC: Recent Labs  Lab 08/22/18 1159 08/23/18 0518  WBC 3.0* 3.0*  NEUTROABS 2.2  --   HGB 10.4* 9.0*  HCT 30.7* 27.2*  MCV 93.3 94.4  PLT 49* 45*   Cardiac Enzymes: No results for input(s): CKTOTAL, CKMB, CKMBINDEX, TROPONINI in the last 168 hours. BNP: Invalid input(s): POCBNP CBG: Recent Labs  Lab 08/22/18 1733 08/22/18 2212 08/23/18 0754 08/23/18 1204 08/23/18 1630  GLUCAP 122* 144* 92 119* 92   D-Dimer No results for input(s): DDIMER in the last 72 hours. Hgb A1c No results for  input(s): HGBA1C in the last 72 hours. Lipid Profile No results for input(s): CHOL, HDL, LDLCALC, TRIG, CHOLHDL, LDLDIRECT in the last 72 hours. Thyroid function studies Recent Labs    08/22/18 1307  TSH 0.304*   Anemia work up Recent Labs    08/23/18 0518  VITAMINB12 188   Urinalysis    Component Value Date/Time   COLORURINE YELLOW 08/22/2018 [place] 08/22/2018 1420   LABSPEC 1.013 08/22/2018 1420   PHURINE 7.0 08/22/2018 1420   GLUCOSEU NEGATIVE 08/22/2018 1420   HGBUR NEGATIVE 08/22/2018 1420   BILIRUBINUR NEGATIVE 08/22/2018 1420   KETONESUR NEGATIVE 08/22/2018 1420   PROTEINUR NEGATIVE 08/22/2018 1420   NITRITE NEGATIVE 08/22/2018 1420   LEUKOCYTESUR NEGATIVE 08/22/2018 1420   Sepsis Labs Invalid input(s): PROCALCITONIN,  WBC,  LACTICIDVEN Microbiology No results found for this or any previous visit (from the past 240 hour(s)).   Time coordinating discharge: 35 minutes  SIGNED:   Rodena Goldmann, DO [facility] 08/23/2018, 5:41 PM Pager [phone]  If 7PM-7AM, please contact night-coverage [email or web address] Password [email or web address]

## 2018-08-23 NOTE — Progress Notes (Signed)
IV discontinued,catheter intact. Discharged home with instructions given on medications and follow up visits,patient and family verbalized understanding.Accompanied by staff to an awaiting vehicle.

## 2018-08-23 NOTE — Care Management Note (Signed)
Case Management Note  Patient Details  Name: Jeremy Chavez MRN: [id number] Date of Birth: 1950-10-09  Subjective/Objective:     Hepatic encephalopathy. CM consulted for help with cost of Rifaxamin. Patient has insurance. CM unable to help with cost. Patient is followed by GI, and they usually can help with patient assistance program if applicable.  GI has been consulted.                Action/Plan: CM will follow and assist as needed.   Expected Discharge Date:     08/24/2018             Expected Discharge Plan:  Home/Self Care  In-House Referral:     Discharge planning Services  CM Consult, Medication Assistance  Post Acute Care Choice:  NA Choice offered to:  NA  DME Arranged:    DME Agency:     HH Arranged:    HH Agency:     Status of Service:  Completed, signed off  If discussed at [employer] of Stay Meetings, dates discussed:    Additional Comments:  Jeremy Chavez, Chauncey Reading, RN 08/23/2018, 1:38 PM

## 2018-08-23 NOTE — Progress Notes (Signed)
Referring Provider: Heath Lark, DO Primary Care Physician:  Glenda Chroman, MD Primary Gastroenterologist:  Dr. Laural Golden  Reason for Consultation:    Hepatic encephalopathy.  HPI:   Patient is 67 year old Caucasian male who has cirrhosis secondary to NAFLD who was initially seen in our office on 10/10/2017 following a brief hospitalization at [facility] for hepatic encephalopathy.  He apparently was diagnosed with cirrhosis 3 years earlier. He underwent EGD on 02/10/2018 revealing small esophageal varices not large enough to be banded as well as evidence of portal hypertensive gastropathy as well as nonbleeding gastric ulcers and bulbar erosions.  H. pylori serology was negative.  He was advised to refrain from using NSAIDs. He had Doppler ultrasound on 05/16/2018 revealing hepatic cirrhosis with portal hypertension and splenomegaly as well as perisplenic varices. He was referred to [facility] for transplant evaluation.  He was initially seen in August 2019. Patient was last seen in the office on 07/31/2018.  He appeared to be stable.  He was also seen by Dr. Enis Gash of [facility] on 08/14/2018 and appeared to be doing well. Patient has been on lactulose and he was also advised to take Xifaxan but he has not been taking because of high co-pay and there was also concern about constipation due to this medication.  Patient's wife called our office yesterday morning that her husband was confused.  It was therefore recommended he be brought to emergency room.  He was evaluated emergency room and felt to have hepatic encephalopathy.  Lactulose dose was increased. Since this morning he has been feeling well.  He denies nausea vomiting fever chills cough abdominal pain melena or rectal bleeding or dysuria. His wife states he had 4 bowel movements day before yesterday.  He does not take any pain medication or sedatives. His appetite has been very good. His wife states that he has not been taking Xifaxan because  monthly co-pays $900 and she is also concerned about him getting constipation while on this drug. He has an appointment to be seen at [facility] in February next year.  Since his meld score is low transplant evaluation has not begun.     Past Medical History:  Diagnosis Date  . Diabetes ([place])   . Hypothyroid   . NAFLD (nonalcoholic fatty liver disease)     Past Surgical History:  Procedure Laterality Date  . carpal tunnel rt hand    . ESOPHAGOGASTRODUODENOSCOPY N/A 02/10/2018   Procedure: ESOPHAGOGASTRODUODENOSCOPY (EGD);  Surgeon: Rogene Houston, MD;  Location: [facility]O [facility];  Service: Endoscopy;  Laterality: N/A;  6:22  . umblical hernia repair      Prior to Admission medications   Medication Sig Start Date End Date Taking? Authorizing Provider  ferrous sulfate 325 (65 FE) MG tablet Take 325 mg by mouth 2 (two) times daily.   Yes [provider]  lactulose (CHRONULAC) 10 GM/15ML solution Take 20 g by mouth 4 (four) times daily.    Yes [provider]  levothyroxine (SYNTHROID, LEVOTHROID) 125 MCG tablet Take 88 mcg by mouth daily before breakfast.    Yes [provider]  lisinopril-hydrochlorothiazide (PRINZIDE,ZESTORETIC) 20-25 MG tablet Take 1 tablet by mouth every evening.    Yes [provider]  metFORMIN (GLUCOPHAGE) 1000 MG tablet Take 500 mg by mouth daily with breakfast.    Yes [provider]  pantoprazole (PROTONIX) 40 MG tablet Take 1 tablet (40 mg total) by mouth 2 (two) times daily. 08/02/18  Yes Setzer, Rona Ravens, NP  pantoprazole (  PROTONIX) 40 MG tablet Take 1 tablet (40 mg total) by mouth 2 (two) times daily before a meal. Patient not taking: Reported on 08/22/2018 04/03/18   Rogene Houston, MD    Current Facility-Administered Medications  Medication Dose Route Frequency Provider Last Rate Last Dose  . acetaminophen (TYLENOL) tablet 650 mg  650 mg Oral Q6H PRN Emokpae, Ejiroghene E, MD       Or  . acetaminophen (TYLENOL)  suppository 650 mg  650 mg Rectal Q6H PRN Emokpae, Ejiroghene E, MD      . ferrous sulfate tablet 325 mg  325 mg Oral BID Emokpae, Ejiroghene E, MD   325 mg at 08/23/18 1006  . lisinopril (PRINIVIL,ZESTRIL) tablet 20 mg  20 mg Oral q1800 Emokpae, Ejiroghene E, MD   20 mg at 08/22/18 2100   And  . hydrochlorothiazide (HYDRODIURIL) tablet 25 mg  25 mg Oral q1800 Emokpae, Ejiroghene E, MD   25 mg at 08/22/18 2155  . insulin aspart (novoLOG) injection 0-9 Units  0-9 Units Subcutaneous TID WC Emokpae, Ejiroghene E, MD      . lactulose (CHRONULAC) 10 GM/15ML solution 30 g  30 g Oral QID Emokpae, Ejiroghene E, MD   30 g at 08/23/18 1005  . levothyroxine (SYNTHROID, LEVOTHROID) tablet 88 mcg  88 mcg Oral Q0600 Emokpae, Ejiroghene E, MD   88 mcg at 08/23/18 0653  . ondansetron (ZOFRAN) tablet 4 mg  4 mg Oral Q6H PRN Emokpae, Ejiroghene E, MD       Or  . ondansetron (ZOFRAN) injection 4 mg  4 mg Intravenous Q6H PRN Emokpae, Ejiroghene E, MD      . pantoprazole (PROTONIX) EC tablet 40 mg  40 mg Oral BID Emokpae, Ejiroghene E, MD   40 mg at 08/23/18 1006  . rifaximin (XIFAXAN) tablet 550 mg  550 mg Oral BID Emokpae, Ejiroghene E, MD   550 mg at 08/23/18 1006    Allergies as of 08/22/2018  . (No Known Allergies)    Family History  Problem Relation Age of Onset  . Liver cancer Father     Social History   Socioeconomic History  . Marital status: Married    Spouse name: Not on file  . Number of children: Not on file  . Years of education: Not on file  . Highest education level: Not on file  Occupational History  . Not on file  Social Needs  . Financial resource strain: Not on file  . Food insecurity:    Worry: Not on file    Inability: Not on file  . Transportation needs:    Medical: Not on file    Non-medical: Not on file  Tobacco Use  . Smoking status: Former Research scientist (life sciences)  . Smokeless tobacco: Never Used  Substance and Sexual Activity  . Alcohol use: No    Frequency: Never  . Drug use: No   . Sexual activity: Not on file  Lifestyle  . Physical activity:    Days per week: Not on file    Minutes per session: Not on file  . Stress: Not on file  Relationships  . Social connections:    Talks on phone: Not on file    Gets together: Not on file    Attends religious service: Not on file    Active member of club or organization: Not on file    Attends meetings of clubs or organizations: Not on file    Relationship status: Not on file  . Intimate partner  violence:    Fear of current or ex partner: Not on file    Emotionally abused: Not on file    Physically abused: Not on file    Forced sexual activity: Not on file  Other Topics Concern  . Not on file  Social History Narrative  . Not on file    Review of Systems: See HPI, otherwise normal ROS  Physical Exam: Temp:  [98 F (36.7 C)-98.7 F (37.1 C)] 98 F (36.7 C) (12/04 1545) Pulse Rate:  [76-93] 80 (12/04 1545) Resp:  [16] 16 (12/03 1835) BP: (110-152)/(58-79) 127/64 (12/04 1545) SpO2:  [99 %-100 %] 99 % (12/04 1545) Weight:  [100.1 kg] 100.1 kg (12/03 1835) Last BM Date: 08/23/18 Patient is alert and in no acute distress. He is oriented to place and person but not to time. He does not have asterixis. Conjunctiva is pale and sclerae nonicteric. No thyromegaly or lymphadenopathy noted. Abdomen is full with easily palpable liver edge below the right costal margin.  Spleen is not palpable. He does not have peripheral edema or clubbing.    Lab Results: Recent Labs    08/22/18 1159 08/23/18 0518  WBC 3.0* 3.0*  HGB 10.4* 9.0*  HCT 30.7* 27.2*  PLT 49* 45*   BMET Recent Labs    08/22/18 1159  NA 138  K 3.8  CL 110  CO2 21*  GLUCOSE 142*  BUN 14  CREATININE 0.98  CALCIUM 10.7*   LFT Recent Labs    08/22/18 1159  PROT 6.6  ALBUMIN 3.6  AST 47*  ALT 27  ALKPHOS 116  BILITOT 2.1*    Studies/Results: Dg Chest Port 1 View  Result Date: 08/22/2018 CLINICAL DATA:  Weakness EXAM: PORTABLE  CHEST 1 VIEW COMPARISON:  10/03/2017 FINDINGS: Cardiac shadow is at the upper limits of normal but accentuated by the portable technique. The lungs are hypoinflated but clear. Degenerative changes of the right shoulder joint are noted. No acute bony abnormality is seen. IMPRESSION: No acute abnormality noted. Electronically Signed   By: Inez Catalina M.D.   On: 08/22/2018 12:33    Assessment;  Hepatic encephalopathy in a patient with known history of cirrhosis secondary to NASH.  First episode occurred in January this year.  No obvious trigger for this episode.  He does not appear to have GI bleed or dehydration.  He definitely would benefit from addition of Xifaxan in addition to lactulose. Patient feels much better and anxious to be discharged. His wife has been informed that he he is under observation.  She was not informed of the status yesterday and therefore is somewhat concerned.  Cirrhosis secondary to NASH. MELD score not calculated as INR not done.  Mild hypercalcemia.  Serum calcium repeated on blood from this morning.  Serum calcium is 10.6 and corrected serum calcium is 11.4.  I do not believe mild hypercalcemia is contributing to his mental status changes.   Recommendations;  Agree with discharge planning. Dietary issues discussed with patient's wife.  He needs to be on steady dose of daily protein rather than intermittent consumption. Goal is for him to have 3-4 bowel movements every day. He should go back on Xifaxan at least at 550 mg daily or half a pill twice daily.   My office will request samples of Xifaxan as well as patient assistance. He can titrate lactulose dose so that he would have bowel movements daily. Work-up for hypercalcemia on an outpatient basis. I will arrange for him to come to  the lab for fasting blood work to include serum calcium, ionized calcium serum phosphate and parathormone level. We will plan to see him in the office in 4 weeks.    LOS: 0 days    Jeremy Chavez  08/23/2018, 5:15 PM

## 2018-08-24 LAB — FOLATE RBC
Folate, Hemolysate: 431.5 ng/mL
Folate, RBC: 1712 ng/mL (ref >498)
Hematocrit: 25.2 % — ABNORMAL LOW (ref 37.5–51.0)

## 2018-08-24 LAB — HEMATOLOGY COMMENTS:

## 2018-08-24 LAB — HIV ANTIBODY (ROUTINE TESTING W REFLEX): HIV Screen 4th Generation wRfx: NONREACTIVE

## 2018-08-29 ENCOUNTER — Telehealth (INDEPENDENT_AMBULATORY_CARE_PROVIDER_SITE_OTHER): Payer: Self-pay | Admitting: *Deleted

## 2018-08-29 NOTE — Telephone Encounter (Signed)
Patient was discharged home December 4 , and per Dr.Rehman  We will plan to see him in the office in 4 weeks. Patient will need appointment first week in January.

## 2018-09-06 DIAGNOSIS — K760 Fatty (change of) liver, not elsewhere classified: Secondary | ICD-10-CM | POA: Diagnosis not present

## 2018-09-07 LAB — AMMONIA: Ammonia: 75 umol/L — ABNORMAL HIGH (ref <=72)

## 2018-09-11 ENCOUNTER — Other Ambulatory Visit (INDEPENDENT_AMBULATORY_CARE_PROVIDER_SITE_OTHER): Payer: Self-pay | Admitting: *Deleted

## 2018-09-11 ENCOUNTER — Encounter (INDEPENDENT_AMBULATORY_CARE_PROVIDER_SITE_OTHER): Payer: Self-pay | Admitting: *Deleted

## 2018-09-11 DIAGNOSIS — K7460 Unspecified cirrhosis of liver: Secondary | ICD-10-CM

## 2018-09-15 ENCOUNTER — Other Ambulatory Visit (INDEPENDENT_AMBULATORY_CARE_PROVIDER_SITE_OTHER): Payer: Self-pay | Admitting: *Deleted

## 2018-09-15 ENCOUNTER — Encounter (INDEPENDENT_AMBULATORY_CARE_PROVIDER_SITE_OTHER): Payer: Self-pay | Admitting: *Deleted

## 2018-09-15 DIAGNOSIS — K760 Fatty (change of) liver, not elsewhere classified: Secondary | ICD-10-CM

## 2018-09-18 ENCOUNTER — Encounter (INDEPENDENT_AMBULATORY_CARE_PROVIDER_SITE_OTHER): Payer: Self-pay | Admitting: *Deleted

## 2018-09-25 ENCOUNTER — Ambulatory Visit (INDEPENDENT_AMBULATORY_CARE_PROVIDER_SITE_OTHER): Payer: Medicare Other | Admitting: Internal Medicine

## 2018-09-26 ENCOUNTER — Ambulatory Visit (INDEPENDENT_AMBULATORY_CARE_PROVIDER_SITE_OTHER): Payer: Medicare HMO | Admitting: Internal Medicine

## 2018-09-26 ENCOUNTER — Encounter (INDEPENDENT_AMBULATORY_CARE_PROVIDER_SITE_OTHER): Payer: Self-pay | Admitting: Internal Medicine

## 2018-09-26 VITALS — BP 150/64 | HR 76 | Temp 97.8°F | Ht 72.0 in | Wt 237.7 lb

## 2018-09-26 DIAGNOSIS — K760 Fatty (change of) liver, not elsewhere classified: Secondary | ICD-10-CM | POA: Diagnosis not present

## 2018-09-26 DIAGNOSIS — K7682 Hepatic encephalopathy: Secondary | ICD-10-CM

## 2018-09-26 DIAGNOSIS — K7290 Hepatic failure, unspecified without coma: Secondary | ICD-10-CM | POA: Diagnosis not present

## 2018-09-26 NOTE — Patient Instructions (Signed)
OV in 4 months. Continue the Lactulose.

## 2018-09-26 NOTE — Progress Notes (Signed)
Subjective:    Patient ID: Jeremy Chavez, male    DOB: 1951-02-08, 68 y.o.   MRN: [id number]  HPIHere today for f/u. Last seen in November of last year Hx of NAFLD. Hx of hepatic encephalopathy and maintained on Lactulose QID.  [facility] to [facility] for [facility] encephalopathy in December and ammonia was 91.  Ammonia 09/06/2018 was 75.  He is having anywhere from 2-4 stools a day.  No periods of confusion.  His appetite is good. He has gained about 4.5 pounds since his last visit. No melena or BRRB.  He has bought a tread mill to exercise.      Has appt at [facility] 10/2018 for his cirrhosis.  Hepatic Function Latest Ref Rng & Units 08/23/2018 08/22/2018 07/31/2018  Total Protein 6.5 - 8.1 g/dL - 6.6 6.1  Albumin 3.5 - 5.0 g/dL 3.0(L) 3.6 -  AST 15 - 41 U/L - 47(H) 27  ALT 0 - 44 U/L - 27 16  Alk Phosphatase 38 - 126 U/L - 116 -  Total Bilirubin 0.3 - 1.2 mg/dL - 2.1(H) 1.9(H)  Bilirubin, Direct 0.0 - 0.2 mg/dL - - 0.5(H)    02/10/2018 EGD Follow up esophageal varices. For therapy of esophageal varices. Grade 1, grade 2 varices found in distal esophagus.  Moderate portal hypertensive gastropathy found in entire examined stomach,  Three non bleeding cratered ulcers with no stigmata of bleeding were found the the pre pyloric region of stomach.  01/11/2018 IgG 1,277, SMA less 20, ANA negative, sedrate 6, 10/10/2017 Ceruloplasmin, alpha 1 antitrypsin normal.  Acute hepatitis panel negative.  8/27/019 US Liver doppler: 1. Hepatic cirrhosis with portal hypertension as evidenced by splenomegaly, perisplenic varices and sluggish flow within the portal veins. 2. The portal and hepatic veins remain patent with normal directional flow. Relatively sluggish flow in the portal veins is consistent with portal hypertension. 3. No discrete hepatic lesion identified.   Hx of diabetes, cirrhosis, hypothyroidism, hypertension Evaluated at [facility] in [place] was  was told he had cirrhosis in 2015 Family hx of fatty liver in a sister who is now deceased at age 59.      Review of Systems Past Medical History:  Diagnosis Date  . Diabetes ([place])   . Hypothyroid   . NAFLD (nonalcoholic fatty liver disease)     Past Surgical History:  Procedure Laterality Date  . carpal tunnel rt hand    . ESOPHAGOGASTRODUODENOSCOPY N/A 02/10/2018   Procedure: ESOPHAGOGASTRODUODENOSCOPY (EGD);  Surgeon: Rogene Houston, MD;  Location: [facility];  Service: Endoscopy;  Laterality: N/A;  1:94  . umblical hernia repair      No Known Allergies  Current Outpatient Medications on File Prior to Visit  Medication Sig Dispense Refill  . bisacodyl (DULCOLAX) 10 MG suppository Place 1 suppository (10 mg total) rectally as needed for moderate constipation. 12 suppository 0  . ferrous sulfate 325 (65 FE) MG tablet Take 325 mg by mouth 2 (two) times daily.    Marland Kitchen lactulose (CHRONULAC) 10 GM/15ML solution Take by mouth 4 (four) times daily. 40cc four times a day.    . levothyroxine (SYNTHROID, LEVOTHROID) 125 MCG tablet Take 88 mcg by mouth daily before breakfast.     . lisinopril-hydrochlorothiazide (PRINZIDE,ZESTORETIC) 20-25 MG tablet Take 1 tablet by mouth every evening.      No current facility-administered medications on file prior to visit.         Objective:   Physical Exam Blood pressure (!) 150/64, pulse  76, temperature 97.8 F (36.6 C), height 6' (1.829 m), weight 237 lb 11.2 oz (107.8 kg). Alert and oriented. Skin warm and dry. Oral mucosa is moist.   . Sclera anicteric, conjunctivae is pink. Thyroid not enlarged. No cervical lymphadenopathy. Lungs clear. Heart regular rate and rhythm. Murmur heard  Abdomen is soft. Bowel sounds are positive. No hepatomegaly. No abdominal masses felt. No tenderness.  No edema to lower extremities. No asterixis.          Assessment & Plan:  NAFLD. Ammonia, CBC and Hepatic. Hepatic encephalopathy. Continue the  Lactulose.  OV in 4 months.

## 2018-09-27 LAB — CBC
HCT: 28.8 % — ABNORMAL LOW (ref 38.5–50.0)
Hemoglobin: 10.2 g/dL — ABNORMAL LOW (ref 13.2–17.1)
MCH: 32.5 pg (ref 27.0–33.0)
MCHC: 35.4 g/dL (ref 32.0–36.0)
MCV: 91.7 fL (ref 80.0–100.0)
MPV: 11.9 fL (ref 7.5–12.5)
Platelets: 56 10*3/uL — ABNORMAL LOW (ref 140–400)
RBC: 3.14 10*6/uL — ABNORMAL LOW (ref 4.20–5.80)
RDW: 13.3 % (ref 11.0–15.0)
WBC: 3.5 10*3/uL — AB (ref 3.8–10.8)

## 2018-09-27 LAB — HEPATIC FUNCTION PANEL
AG Ratio: 1.3 (calc) (ref 1.0–2.5)
ALT: 27 U/L (ref 9–46)
AST: 39 U/L — ABNORMAL HIGH (ref 10–35)
Albumin: 3.4 g/dL — ABNORMAL LOW (ref 3.6–5.1)
Alkaline phosphatase (APISO): 120 U/L — ABNORMAL HIGH (ref 40–115)
Bilirubin, Direct: 0.5 mg/dL — ABNORMAL HIGH (ref 0.0–0.2)
Globulin: 2.6 g/dL (calc) (ref 1.9–3.7)
Indirect Bilirubin: 1.7 mg/dL (calc) — ABNORMAL HIGH (ref 0.2–1.2)
Total Bilirubin: 2.2 mg/dL — ABNORMAL HIGH (ref 0.2–1.2)
Total Protein: 6 g/dL — ABNORMAL LOW (ref 6.1–8.1)

## 2018-09-27 LAB — AMMONIA: AMMONIA: 77 umol/L — AB (ref <=72)

## 2018-10-02 ENCOUNTER — Other Ambulatory Visit (INDEPENDENT_AMBULATORY_CARE_PROVIDER_SITE_OTHER): Payer: Self-pay | Admitting: *Deleted

## 2018-10-02 DIAGNOSIS — K760 Fatty (change of) liver, not elsewhere classified: Secondary | ICD-10-CM

## 2018-10-12 ENCOUNTER — Telehealth (INDEPENDENT_AMBULATORY_CARE_PROVIDER_SITE_OTHER): Payer: Self-pay | Admitting: Internal Medicine

## 2018-10-12 NOTE — Telephone Encounter (Signed)
Patients wife would like a call back regarding orders for patients lab work - ph# [phone]

## 2018-10-13 NOTE — Telephone Encounter (Signed)
Spoke with wife. Ammonia 10/27/2018

## 2018-10-16 ENCOUNTER — Encounter (INDEPENDENT_AMBULATORY_CARE_PROVIDER_SITE_OTHER): Payer: Self-pay | Admitting: *Deleted

## 2018-10-16 ENCOUNTER — Other Ambulatory Visit (INDEPENDENT_AMBULATORY_CARE_PROVIDER_SITE_OTHER): Payer: Self-pay | Admitting: *Deleted

## 2018-10-16 DIAGNOSIS — K760 Fatty (change of) liver, not elsewhere classified: Secondary | ICD-10-CM

## 2018-10-30 DIAGNOSIS — K760 Fatty (change of) liver, not elsewhere classified: Secondary | ICD-10-CM | POA: Diagnosis not present

## 2018-10-31 ENCOUNTER — Other Ambulatory Visit (INDEPENDENT_AMBULATORY_CARE_PROVIDER_SITE_OTHER): Payer: Self-pay | Admitting: *Deleted

## 2018-10-31 DIAGNOSIS — K760 Fatty (change of) liver, not elsewhere classified: Secondary | ICD-10-CM

## 2018-10-31 LAB — AMMONIA: Ammonia: 104 umol/L — ABNORMAL HIGH (ref <=72)

## 2018-11-08 DIAGNOSIS — I8500 Esophageal varices without bleeding: Secondary | ICD-10-CM | POA: Diagnosis not present

## 2018-11-08 DIAGNOSIS — K7460 Unspecified cirrhosis of liver: Secondary | ICD-10-CM | POA: Diagnosis not present

## 2018-11-08 DIAGNOSIS — K7581 Nonalcoholic steatohepatitis (NASH): Secondary | ICD-10-CM | POA: Diagnosis not present

## 2018-11-08 DIAGNOSIS — K7290 Hepatic failure, unspecified without coma: Secondary | ICD-10-CM | POA: Diagnosis not present

## 2018-11-08 DIAGNOSIS — Z87891 Personal history of nicotine dependence: Secondary | ICD-10-CM | POA: Diagnosis not present

## 2018-11-08 DIAGNOSIS — R188 Other ascites: Secondary | ICD-10-CM | POA: Diagnosis not present

## 2018-11-08 DIAGNOSIS — K766 Portal hypertension: Secondary | ICD-10-CM | POA: Diagnosis not present

## 2018-11-14 ENCOUNTER — Encounter (INDEPENDENT_AMBULATORY_CARE_PROVIDER_SITE_OTHER): Payer: Self-pay | Admitting: *Deleted

## 2018-11-15 ENCOUNTER — Other Ambulatory Visit (INDEPENDENT_AMBULATORY_CARE_PROVIDER_SITE_OTHER): Payer: Self-pay | Admitting: *Deleted

## 2018-11-15 ENCOUNTER — Encounter (INDEPENDENT_AMBULATORY_CARE_PROVIDER_SITE_OTHER): Payer: Self-pay | Admitting: *Deleted

## 2018-11-15 DIAGNOSIS — K760 Fatty (change of) liver, not elsewhere classified: Secondary | ICD-10-CM

## 2018-11-28 DIAGNOSIS — K760 Fatty (change of) liver, not elsewhere classified: Secondary | ICD-10-CM | POA: Diagnosis not present

## 2018-11-29 LAB — AMMONIA: Ammonia: 95 umol/L — ABNORMAL HIGH (ref <=72)

## 2018-11-30 ENCOUNTER — Other Ambulatory Visit (INDEPENDENT_AMBULATORY_CARE_PROVIDER_SITE_OTHER): Payer: Self-pay | Admitting: *Deleted

## 2018-11-30 DIAGNOSIS — Z299 Encounter for prophylactic measures, unspecified: Secondary | ICD-10-CM | POA: Diagnosis not present

## 2018-11-30 DIAGNOSIS — R7309 Other abnormal glucose: Secondary | ICD-10-CM | POA: Diagnosis not present

## 2018-11-30 DIAGNOSIS — K760 Fatty (change of) liver, not elsewhere classified: Secondary | ICD-10-CM

## 2018-11-30 DIAGNOSIS — K7290 Hepatic failure, unspecified without coma: Secondary | ICD-10-CM | POA: Diagnosis not present

## 2018-11-30 DIAGNOSIS — Z6831 Body mass index (BMI) 31.0-31.9, adult: Secondary | ICD-10-CM | POA: Diagnosis not present

## 2018-11-30 DIAGNOSIS — E039 Hypothyroidism, unspecified: Secondary | ICD-10-CM | POA: Diagnosis not present

## 2018-11-30 DIAGNOSIS — I10 Essential (primary) hypertension: Secondary | ICD-10-CM | POA: Diagnosis not present

## 2018-12-07 ENCOUNTER — Encounter (INDEPENDENT_AMBULATORY_CARE_PROVIDER_SITE_OTHER): Payer: Self-pay | Admitting: *Deleted

## 2018-12-07 ENCOUNTER — Other Ambulatory Visit (INDEPENDENT_AMBULATORY_CARE_PROVIDER_SITE_OTHER): Payer: Self-pay | Admitting: *Deleted

## 2018-12-07 DIAGNOSIS — K760 Fatty (change of) liver, not elsewhere classified: Secondary | ICD-10-CM

## 2018-12-28 DIAGNOSIS — K760 Fatty (change of) liver, not elsewhere classified: Secondary | ICD-10-CM | POA: Diagnosis not present

## 2018-12-29 LAB — AMMONIA: AMMONIA: 137 umol/L — AB (ref <=72)

## 2019-01-09 ENCOUNTER — Other Ambulatory Visit (INDEPENDENT_AMBULATORY_CARE_PROVIDER_SITE_OTHER): Payer: Self-pay | Admitting: *Deleted

## 2019-01-09 DIAGNOSIS — K760 Fatty (change of) liver, not elsewhere classified: Secondary | ICD-10-CM

## 2019-01-10 ENCOUNTER — Encounter (INDEPENDENT_AMBULATORY_CARE_PROVIDER_SITE_OTHER): Payer: Self-pay | Admitting: *Deleted

## 2019-01-10 ENCOUNTER — Other Ambulatory Visit (INDEPENDENT_AMBULATORY_CARE_PROVIDER_SITE_OTHER): Payer: Self-pay | Admitting: *Deleted

## 2019-01-10 DIAGNOSIS — K760 Fatty (change of) liver, not elsewhere classified: Secondary | ICD-10-CM

## 2019-01-10 NOTE — Progress Notes (Signed)
Other

## 2019-01-25 ENCOUNTER — Ambulatory Visit (INDEPENDENT_AMBULATORY_CARE_PROVIDER_SITE_OTHER): Payer: Medicare HMO | Admitting: Internal Medicine

## 2019-01-25 ENCOUNTER — Encounter (INDEPENDENT_AMBULATORY_CARE_PROVIDER_SITE_OTHER): Payer: Self-pay | Admitting: Internal Medicine

## 2019-01-25 ENCOUNTER — Other Ambulatory Visit: Payer: Self-pay

## 2019-01-25 VITALS — BP 104/57 | HR 75 | Temp 98.1°F | Ht 73.0 in | Wt 226.4 lb

## 2019-01-25 DIAGNOSIS — K7460 Unspecified cirrhosis of liver: Secondary | ICD-10-CM | POA: Diagnosis not present

## 2019-01-25 DIAGNOSIS — K7290 Hepatic failure, unspecified without coma: Secondary | ICD-10-CM | POA: Diagnosis not present

## 2019-01-25 DIAGNOSIS — K7682 Hepatic encephalopathy: Secondary | ICD-10-CM

## 2019-01-25 NOTE — Patient Instructions (Signed)
EGD with possible banding.

## 2019-01-25 NOTE — Progress Notes (Signed)
   Subjective:    Patient ID: Jeremy Chavez, male    DOB: March 31, 1951, 68 y.o.   MRN: [id number]  HPI Here today for f/u.  Hx of NAFLD secondary to NASH.  Hx of Hepatic encephalopathy and maintained on Lactulose QID.  Admitted to [facility] in December of 2019 for hepatic encephalopathy.  Underwent an EGD with  in May of 2019.  Found to have Grade 1 and Grade 11 varices in distal esophagus. Moderate portal hypertensive gastropathy.  Three non bleeding cratered gastric ulcer with no stigmata of bleeding found in the prepyloric region of the stomach. H pylori was negative. His appetite is good. He has lost 11 pounds since his visit in January which was intentional. He state he feels good. No longer taking diabetic medication  Has been evaluated at [facility] the [facility] in February of this year. Returns in June of this year for f/u. Request EGD with possible banding.  Having 5-6 stools a day with the Lactulose.   12/28/2018 .ammonia 137.    Review of Systems  Past Medical History:  Diagnosis Date  . Diabetes ([place])   . Hypothyroid   . NAFLD (nonalcoholic fatty liver disease)     Past Surgical History:  Procedure Laterality Date  . carpal tunnel rt hand    . ESOPHAGOGASTRODUODENOSCOPY N/A 02/10/2018   Procedure: ESOPHAGOGASTRODUODENOSCOPY (EGD);  Surgeon: Rogene Houston, MD;  [facility];  Service: Endoscopy;  Laterality: N/A;  5:09  . umblical hernia repair      No Known Allergies  Current Outpatient Medications on File Prior to Visit  Medication Sig Dispense Refill  . bisacodyl (DULCOLAX) 10 MG suppository Place 1 suppository (10 mg total) rectally as needed for moderate constipation. 12 suppository 0  . ferrous sulfate 325 (65 FE) MG tablet Take 325 mg by mouth 2 (two) times daily.    . furosemide (LASIX) 20 MG tablet Take 20 mg by mouth.    . lactulose (CHRONULAC) 10 GM/15ML solution Take by mouth 4 (four) times daily. 45cc four times a day.    . levothyroxine  (SYNTHROID, LEVOTHROID) 125 MCG tablet Take 88 mcg by mouth daily before breakfast.     . lisinopril-hydrochlorothiazide (PRINZIDE,ZESTORETIC) 20-25 MG tablet Take 1 tablet by mouth every evening.     . pantoprazole (PROTONIX) 40 MG tablet Take 40 mg by mouth 2 (two) times daily before a meal.     No current facility-administered medications on file prior to visit.             Objective:   Physical Exam Blood pressure (!) 104/57, pulse 75, temperature 98.1 F (36.7 C), height 6' 1"  (1.854 m), weight 226 lb 6.4 oz (102.7 kg). Alert and oriented. Skin warm and dry. Oral mucosa is moist.   . Sclera anicteric, conjunctivae is pink. Thyroid not enlarged. No cervical lymphadenopathy. Lungs clear. Heart regular rate and rhythm.  Abdomen is soft. Bowel sounds are positive. No hepatomegaly. No abdominal masses felt. No tenderness.  No edema to lower extremities.          Assessment & Plan:  Cirrhosis/NASH: Will get an Hepatic Hepatic encephalopathy: will get an ammonia. Esophageal varices: EGD with possible banding.

## 2019-01-29 ENCOUNTER — Ambulatory Visit (INDEPENDENT_AMBULATORY_CARE_PROVIDER_SITE_OTHER): Payer: Medicare Other | Admitting: Internal Medicine

## 2019-02-02 DIAGNOSIS — K7460 Unspecified cirrhosis of liver: Secondary | ICD-10-CM | POA: Diagnosis not present

## 2019-02-02 LAB — HEPATIC FUNCTION PANEL
AG Ratio: 1.3 (calc) (ref 1.0–2.5)
ALT: 23 U/L (ref 9–46)
AST: 34 U/L (ref 10–35)
Albumin: 3.6 g/dL (ref 3.6–5.1)
Alkaline phosphatase (APISO): 90 U/L (ref 35–144)
Bilirubin, Direct: 0.5 mg/dL — ABNORMAL HIGH (ref 0.0–0.2)
Globulin: 2.7 g/dL (calc) (ref 1.9–3.7)
Indirect Bilirubin: 1.5 mg/dL (calc) — ABNORMAL HIGH (ref 0.2–1.2)
Total Bilirubin: 2 mg/dL — ABNORMAL HIGH (ref 0.2–1.2)
Total Protein: 6.3 g/dL (ref 6.1–8.1)

## 2019-02-07 ENCOUNTER — Other Ambulatory Visit (INDEPENDENT_AMBULATORY_CARE_PROVIDER_SITE_OTHER): Payer: Self-pay | Admitting: *Deleted

## 2019-02-07 DIAGNOSIS — K7460 Unspecified cirrhosis of liver: Secondary | ICD-10-CM

## 2019-02-07 NOTE — Progress Notes (Signed)
apti

## 2019-02-20 ENCOUNTER — Ambulatory Visit (INDEPENDENT_AMBULATORY_CARE_PROVIDER_SITE_OTHER): Payer: Medicare HMO | Admitting: Urology

## 2019-02-20 DIAGNOSIS — N281 Cyst of kidney, acquired: Secondary | ICD-10-CM

## 2019-02-22 ENCOUNTER — Encounter (INDEPENDENT_AMBULATORY_CARE_PROVIDER_SITE_OTHER): Payer: Self-pay | Admitting: *Deleted

## 2019-02-22 ENCOUNTER — Other Ambulatory Visit (HOSPITAL_COMMUNITY): Payer: Self-pay | Admitting: Urology

## 2019-02-22 ENCOUNTER — Other Ambulatory Visit (INDEPENDENT_AMBULATORY_CARE_PROVIDER_SITE_OTHER): Payer: Self-pay | Admitting: Internal Medicine

## 2019-02-22 DIAGNOSIS — K7460 Unspecified cirrhosis of liver: Secondary | ICD-10-CM

## 2019-02-22 DIAGNOSIS — K760 Fatty (change of) liver, not elsewhere classified: Secondary | ICD-10-CM

## 2019-02-22 DIAGNOSIS — N281 Cyst of kidney, acquired: Secondary | ICD-10-CM

## 2019-02-23 DIAGNOSIS — K7581 Nonalcoholic steatohepatitis (NASH): Secondary | ICD-10-CM | POA: Diagnosis not present

## 2019-02-23 DIAGNOSIS — R188 Other ascites: Secondary | ICD-10-CM | POA: Diagnosis not present

## 2019-02-23 DIAGNOSIS — K7460 Unspecified cirrhosis of liver: Secondary | ICD-10-CM | POA: Diagnosis not present

## 2019-03-01 ENCOUNTER — Other Ambulatory Visit: Payer: Self-pay

## 2019-03-01 ENCOUNTER — Ambulatory Visit (HOSPITAL_COMMUNITY)
Admission: RE | Admit: 2019-03-01 | Discharge: 2019-03-01 | Disposition: A | Discharge status: Home / Self Care | Payer: Medicare HMO | Source: Ambulatory Visit | Attending: Urology | Admitting: Urology

## 2019-03-01 DIAGNOSIS — N281 Cyst of kidney, acquired: Secondary | ICD-10-CM | POA: Insufficient documentation

## 2019-03-16 DIAGNOSIS — R69 Illness, unspecified: Secondary | ICD-10-CM | POA: Diagnosis not present

## 2019-03-28 DIAGNOSIS — Z79899 Other long term (current) drug therapy: Secondary | ICD-10-CM | POA: Diagnosis not present

## 2019-03-28 DIAGNOSIS — Z6830 Body mass index (BMI) 30.0-30.9, adult: Secondary | ICD-10-CM | POA: Diagnosis not present

## 2019-03-28 DIAGNOSIS — Z1339 Encounter for screening examination for other mental health and behavioral disorders: Secondary | ICD-10-CM | POA: Diagnosis not present

## 2019-03-28 DIAGNOSIS — Z1211 Encounter for screening for malignant neoplasm of colon: Secondary | ICD-10-CM | POA: Diagnosis not present

## 2019-03-28 DIAGNOSIS — R5383 Other fatigue: Secondary | ICD-10-CM | POA: Diagnosis not present

## 2019-03-28 DIAGNOSIS — E039 Hypothyroidism, unspecified: Secondary | ICD-10-CM | POA: Diagnosis not present

## 2019-03-28 DIAGNOSIS — E7800 Pure hypercholesterolemia, unspecified: Secondary | ICD-10-CM | POA: Diagnosis not present

## 2019-03-28 DIAGNOSIS — Z125 Encounter for screening for malignant neoplasm of prostate: Secondary | ICD-10-CM | POA: Diagnosis not present

## 2019-03-28 DIAGNOSIS — Z1331 Encounter for screening for depression: Secondary | ICD-10-CM | POA: Diagnosis not present

## 2019-03-28 DIAGNOSIS — Z7189 Other specified counseling: Secondary | ICD-10-CM | POA: Diagnosis not present

## 2019-03-28 DIAGNOSIS — Z0000 Encounter for general adult medical examination without abnormal findings: Secondary | ICD-10-CM | POA: Diagnosis not present

## 2019-03-28 DIAGNOSIS — Z87891 Personal history of nicotine dependence: Secondary | ICD-10-CM | POA: Diagnosis not present

## 2019-03-28 DIAGNOSIS — I10 Essential (primary) hypertension: Secondary | ICD-10-CM | POA: Diagnosis not present

## 2019-03-29 LAB — TSH: TSH: 7.9 — AB (ref 0.41–5.90)

## 2019-04-10 ENCOUNTER — Other Ambulatory Visit: Payer: Self-pay

## 2019-04-10 ENCOUNTER — Other Ambulatory Visit (HOSPITAL_COMMUNITY)
Admission: RE | Admit: 2019-04-10 | Discharge: 2019-04-10 | Disposition: A | Discharge status: Home / Self Care | Payer: Medicare HMO | Source: Ambulatory Visit | Attending: Internal Medicine | Admitting: Internal Medicine

## 2019-04-10 DIAGNOSIS — E860 Dehydration: Secondary | ICD-10-CM | POA: Diagnosis not present

## 2019-04-10 DIAGNOSIS — K7200 Acute and subacute hepatic failure without coma: Secondary | ICD-10-CM | POA: Diagnosis not present

## 2019-04-10 DIAGNOSIS — R4182 Altered mental status, unspecified: Secondary | ICD-10-CM | POA: Diagnosis not present

## 2019-04-10 DIAGNOSIS — D649 Anemia, unspecified: Secondary | ICD-10-CM | POA: Diagnosis not present

## 2019-04-10 DIAGNOSIS — N179 Acute kidney failure, unspecified: Secondary | ICD-10-CM | POA: Diagnosis not present

## 2019-04-10 DIAGNOSIS — R0902 Hypoxemia: Secondary | ICD-10-CM | POA: Diagnosis not present

## 2019-04-10 DIAGNOSIS — K7460 Unspecified cirrhosis of liver: Secondary | ICD-10-CM | POA: Diagnosis not present

## 2019-04-10 DIAGNOSIS — S0990XA Unspecified injury of head, initial encounter: Secondary | ICD-10-CM | POA: Diagnosis not present

## 2019-04-10 DIAGNOSIS — I10 Essential (primary) hypertension: Secondary | ICD-10-CM | POA: Diagnosis not present

## 2019-04-10 DIAGNOSIS — Z209 Contact with and (suspected) exposure to unspecified communicable disease: Secondary | ICD-10-CM | POA: Diagnosis not present

## 2019-04-10 DIAGNOSIS — D696 Thrombocytopenia, unspecified: Secondary | ICD-10-CM | POA: Diagnosis not present

## 2019-04-10 DIAGNOSIS — R0689 Other abnormalities of breathing: Secondary | ICD-10-CM | POA: Diagnosis not present

## 2019-04-10 DIAGNOSIS — S299XXA Unspecified injury of thorax, initial encounter: Secondary | ICD-10-CM | POA: Diagnosis not present

## 2019-04-10 DIAGNOSIS — K7290 Hepatic failure, unspecified without coma: Secondary | ICD-10-CM | POA: Diagnosis not present

## 2019-04-10 DIAGNOSIS — D638 Anemia in other chronic diseases classified elsewhere: Secondary | ICD-10-CM | POA: Diagnosis not present

## 2019-04-10 DIAGNOSIS — E039 Hypothyroidism, unspecified: Secondary | ICD-10-CM | POA: Diagnosis not present

## 2019-04-10 DIAGNOSIS — R404 Transient alteration of awareness: Secondary | ICD-10-CM | POA: Diagnosis not present

## 2019-04-10 DIAGNOSIS — S199XXA Unspecified injury of neck, initial encounter: Secondary | ICD-10-CM | POA: Diagnosis not present

## 2019-04-10 DIAGNOSIS — N289 Disorder of kidney and ureter, unspecified: Secondary | ICD-10-CM | POA: Diagnosis not present

## 2019-04-10 DIAGNOSIS — E119 Type 2 diabetes mellitus without complications: Secondary | ICD-10-CM | POA: Diagnosis not present

## 2019-04-12 ENCOUNTER — Encounter (HOSPITAL_COMMUNITY): Admission: RE | Payer: Self-pay | Source: Ambulatory Visit

## 2019-04-12 ENCOUNTER — Ambulatory Visit (HOSPITAL_COMMUNITY): Admission: RE | Admit: 2019-04-12 | Payer: Medicare HMO | Source: Ambulatory Visit | Admitting: Internal Medicine

## 2019-04-12 SURGERY — EGD (ESOPHAGOGASTRODUODENOSCOPY)
Anesthesia: Moderate Sedation

## 2019-04-18 DIAGNOSIS — Z09 Encounter for follow-up examination after completed treatment for conditions other than malignant neoplasm: Secondary | ICD-10-CM | POA: Diagnosis not present

## 2019-04-18 DIAGNOSIS — K7460 Unspecified cirrhosis of liver: Secondary | ICD-10-CM | POA: Diagnosis not present

## 2019-04-18 DIAGNOSIS — Z79899 Other long term (current) drug therapy: Secondary | ICD-10-CM | POA: Diagnosis not present

## 2019-04-18 DIAGNOSIS — I10 Essential (primary) hypertension: Secondary | ICD-10-CM | POA: Diagnosis not present

## 2019-04-18 DIAGNOSIS — K7290 Hepatic failure, unspecified without coma: Secondary | ICD-10-CM | POA: Diagnosis not present

## 2019-04-18 DIAGNOSIS — Z6830 Body mass index (BMI) 30.0-30.9, adult: Secondary | ICD-10-CM | POA: Diagnosis not present

## 2019-04-20 DIAGNOSIS — K7110 Toxic liver disease with hepatic necrosis, without coma: Secondary | ICD-10-CM | POA: Diagnosis not present

## 2019-04-20 DIAGNOSIS — T50905A Adverse effect of unspecified drugs, medicaments and biological substances, initial encounter: Secondary | ICD-10-CM | POA: Diagnosis not present

## 2019-04-24 ENCOUNTER — Encounter (INDEPENDENT_AMBULATORY_CARE_PROVIDER_SITE_OTHER): Payer: Self-pay | Admitting: *Deleted

## 2019-04-24 ENCOUNTER — Other Ambulatory Visit (INDEPENDENT_AMBULATORY_CARE_PROVIDER_SITE_OTHER): Payer: Self-pay | Admitting: *Deleted

## 2019-04-24 DIAGNOSIS — K7460 Unspecified cirrhosis of liver: Secondary | ICD-10-CM

## 2019-04-30 DIAGNOSIS — K7460 Unspecified cirrhosis of liver: Secondary | ICD-10-CM | POA: Diagnosis not present

## 2019-05-04 DIAGNOSIS — Z299 Encounter for prophylactic measures, unspecified: Secondary | ICD-10-CM | POA: Diagnosis not present

## 2019-05-04 DIAGNOSIS — K7460 Unspecified cirrhosis of liver: Secondary | ICD-10-CM | POA: Diagnosis not present

## 2019-05-04 DIAGNOSIS — I10 Essential (primary) hypertension: Secondary | ICD-10-CM | POA: Diagnosis not present

## 2019-05-04 DIAGNOSIS — E7800 Pure hypercholesterolemia, unspecified: Secondary | ICD-10-CM | POA: Diagnosis not present

## 2019-05-04 DIAGNOSIS — E039 Hypothyroidism, unspecified: Secondary | ICD-10-CM | POA: Diagnosis not present

## 2019-05-04 DIAGNOSIS — K7290 Hepatic failure, unspecified without coma: Secondary | ICD-10-CM | POA: Diagnosis not present

## 2019-05-04 DIAGNOSIS — Z6830 Body mass index (BMI) 30.0-30.9, adult: Secondary | ICD-10-CM | POA: Diagnosis not present

## 2019-05-04 LAB — PROTIME-INR

## 2019-05-15 DIAGNOSIS — K7460 Unspecified cirrhosis of liver: Secondary | ICD-10-CM | POA: Diagnosis not present

## 2019-05-21 DIAGNOSIS — E875 Hyperkalemia: Secondary | ICD-10-CM | POA: Diagnosis not present

## 2019-05-21 DIAGNOSIS — Z79899 Other long term (current) drug therapy: Secondary | ICD-10-CM | POA: Diagnosis not present

## 2019-05-21 DIAGNOSIS — K760 Fatty (change of) liver, not elsewhere classified: Secondary | ICD-10-CM | POA: Diagnosis not present

## 2019-05-21 DIAGNOSIS — R799 Abnormal finding of blood chemistry, unspecified: Secondary | ICD-10-CM | POA: Diagnosis not present

## 2019-05-21 DIAGNOSIS — E119 Type 2 diabetes mellitus without complications: Secondary | ICD-10-CM | POA: Diagnosis not present

## 2019-05-21 DIAGNOSIS — Z87891 Personal history of nicotine dependence: Secondary | ICD-10-CM | POA: Diagnosis not present

## 2019-05-22 ENCOUNTER — Other Ambulatory Visit: Payer: Self-pay

## 2019-05-22 ENCOUNTER — Other Ambulatory Visit (HOSPITAL_COMMUNITY)
Admission: RE | Admit: 2019-05-22 | Discharge: 2019-05-22 | Disposition: A | Discharge status: Home / Self Care | Payer: Medicare HMO | Source: Ambulatory Visit | Attending: Internal Medicine | Admitting: Internal Medicine

## 2019-05-22 DIAGNOSIS — Z01812 Encounter for preprocedural laboratory examination: Secondary | ICD-10-CM | POA: Insufficient documentation

## 2019-05-22 DIAGNOSIS — Z20828 Contact with and (suspected) exposure to other viral communicable diseases: Secondary | ICD-10-CM | POA: Insufficient documentation

## 2019-05-22 LAB — SARS CORONAVIRUS 2 (TAT 6-24 HRS): SARS Coronavirus 2: NEGATIVE

## 2019-05-24 ENCOUNTER — Ambulatory Visit (HOSPITAL_COMMUNITY)
Admission: RE | Admit: 2019-05-24 | Discharge: 2019-05-24 | Disposition: A | Discharge status: Home / Self Care | Payer: Medicare HMO | Attending: Internal Medicine | Admitting: Internal Medicine

## 2019-05-24 ENCOUNTER — Encounter (HOSPITAL_COMMUNITY): Payer: Self-pay

## 2019-05-24 ENCOUNTER — Other Ambulatory Visit: Payer: Self-pay

## 2019-05-24 ENCOUNTER — Encounter (HOSPITAL_COMMUNITY)
Admission: RE | Disposition: A | Discharge status: Home / Self Care | Payer: Self-pay | Source: Home / Self Care | Attending: Internal Medicine

## 2019-05-24 DIAGNOSIS — Z87891 Personal history of nicotine dependence: Secondary | ICD-10-CM | POA: Insufficient documentation

## 2019-05-24 DIAGNOSIS — K766 Portal hypertension: Secondary | ICD-10-CM | POA: Insufficient documentation

## 2019-05-24 DIAGNOSIS — K7581 Nonalcoholic steatohepatitis (NASH): Secondary | ICD-10-CM | POA: Insufficient documentation

## 2019-05-24 DIAGNOSIS — Z79890 Hormone replacement therapy (postmenopausal): Secondary | ICD-10-CM | POA: Diagnosis not present

## 2019-05-24 DIAGNOSIS — I8500 Esophageal varices without bleeding: Secondary | ICD-10-CM | POA: Diagnosis not present

## 2019-05-24 DIAGNOSIS — K259 Gastric ulcer, unspecified as acute or chronic, without hemorrhage or perforation: Secondary | ICD-10-CM | POA: Diagnosis not present

## 2019-05-24 DIAGNOSIS — I8510 Secondary esophageal varices without bleeding: Secondary | ICD-10-CM | POA: Insufficient documentation

## 2019-05-24 DIAGNOSIS — Z808 Family history of malignant neoplasm of other organs or systems: Secondary | ICD-10-CM | POA: Diagnosis not present

## 2019-05-24 DIAGNOSIS — E039 Hypothyroidism, unspecified: Secondary | ICD-10-CM | POA: Diagnosis not present

## 2019-05-24 DIAGNOSIS — K7290 Hepatic failure, unspecified without coma: Secondary | ICD-10-CM | POA: Insufficient documentation

## 2019-05-24 DIAGNOSIS — K7460 Unspecified cirrhosis of liver: Secondary | ICD-10-CM | POA: Diagnosis not present

## 2019-05-24 DIAGNOSIS — E119 Type 2 diabetes mellitus without complications: Secondary | ICD-10-CM | POA: Diagnosis not present

## 2019-05-24 DIAGNOSIS — K3189 Other diseases of stomach and duodenum: Secondary | ICD-10-CM | POA: Diagnosis not present

## 2019-05-24 DIAGNOSIS — Z79899 Other long term (current) drug therapy: Secondary | ICD-10-CM | POA: Insufficient documentation

## 2019-05-24 HISTORY — PX: ESOPHAGOGASTRODUODENOSCOPY: SHX5428

## 2019-05-24 SURGERY — EGD (ESOPHAGOGASTRODUODENOSCOPY)
Anesthesia: Moderate Sedation

## 2019-05-24 MED ORDER — LIDOCAINE VISCOUS HCL 2 % MT SOLN
OROMUCOSAL | Status: DC | PRN
  Administered 2019-05-24: 1 via OROMUCOSAL

## 2019-05-24 MED ORDER — MEPERIDINE HCL 50 MG/ML IJ SOLN
INTRAMUSCULAR | Status: DC | PRN
  Administered 2019-05-24: 25 mg

## 2019-05-24 MED ORDER — SUCRALFATE 1 G PO TABS: 1.0000 g | ORAL_TABLET | Freq: Three times a day (TID) | ORAL | 2 refills | Status: DC

## 2019-05-24 MED ORDER — LIDOCAINE VISCOUS HCL 2 % MT SOLN
OROMUCOSAL | Status: AC
  Filled 2019-05-24: qty 15

## 2019-05-24 MED ORDER — MIDAZOLAM HCL 5 MG/5ML IJ SOLN
INTRAMUSCULAR | Status: DC | PRN
  Administered 2019-05-24 (×2): 2 mg via INTRAVENOUS

## 2019-05-24 MED ORDER — MEPERIDINE HCL 50 MG/ML IJ SOLN
INTRAMUSCULAR | Status: AC
  Filled 2019-05-24: qty 1

## 2019-05-24 MED ORDER — MIDAZOLAM HCL 5 MG/5ML IJ SOLN
INTRAMUSCULAR | Status: AC
  Filled 2019-05-24: qty 10

## 2019-05-24 MED ORDER — SODIUM CHLORIDE 0.9 % IV SOLN
INTRAVENOUS | Status: DC
  Administered 2019-05-24: 07:00:00 via INTRAVENOUS

## 2019-05-24 NOTE — Op Note (Signed)
[facility] Patient Name: Jeremy Chavez Procedure Date: 05/24/2019 7:13 AM MRN: [id number] Date of Birth: 12-Mar-1951 Attending MD: Hildred Laser , MD CSN: [id number] Age: 67 Admit Type: Outpatient Procedure:                Upper GI endoscopy Indications:              Esophageal varices, Follow-up of esophageal varices Providers:                Hildred Laser, MD, Janeece Riggers, RN, Randa Spike,                            Technician Referring MD:             Glenda Chroman and Rema Fendt, NP([facility]) Medicines:                Lidocaine spray, Meperidine 50 mg IV, Midazolam 4                            mg IV Complications:            No immediate complications. Estimated Blood Loss:     Estimated blood loss: none. Procedure:                Pre-Anesthesia Assessment:                           - Prior to the procedure, a History and Physical                            was performed, and patient medications and                            allergies were reviewed. The patient's tolerance of                            previous anesthesia was also reviewed. The risks                            and benefits of the procedure and the sedation                            options and risks were discussed with the patient.                            All questions were answered, and informed consent                            was obtained. Prior Anticoagulants: The patient has                            taken no previous anticoagulant or antiplatelet                            agents. ASA Grade Assessment: III - A patient with  severe systemic disease. After reviewing the risks                            and benefits, the patient was deemed in                            satisfactory condition to undergo the procedure.                           After obtaining informed consent, the endoscope was                            passed under direct vision. Throughout  the                            procedure, the patient's blood pressure, pulse, and                            oxygen saturations were monitored continuously. The                            [id number]-[id number] ([id number]) scope was introduced through the                            mouth, and advanced to the second part of duodenum.                            The upper GI endoscopy was accomplished without                            difficulty. The patient tolerated the procedure                            well. Scope In: 7:43:39 AM Scope Out: 7:51:38 AM Total Procedure Duration: 0 hours 7 minutes 59 seconds  Findings:      The proximal esophagus and mid esophagus were normal.      Grade I varices were found in the distal esophagus.      The Z-line was regular and was found 45 cm from the incisors.      Moderate portal hypertensive gastropathy was found in the entire       examined stomach.      A few erosions were found in the gastric antrum.      The duodenal bulb was normal.      Congested mucosa without active bleeding and with no stigmata of       bleeding was found in the second portion of the duodenum. Impression:               - Normal proximal esophagus and mid esophagus.                           - Grade I esophageal varices. Varices have not                            enlarged since EGD of  May, 2019.                           - Z-line regular, 45 cm from the incisors.                           - Portal hypertensive gastropathy with erosions at                            antrum;some covered with clots.                           - Erosive gastropathy.                           - Normal duodenal bulb.                           - Congested duodenal mucosa.                           - No specimens collected.                           Comment: H. Pylori serology negative in May, 2019. Moderate Sedation:      Moderate (conscious) sedation was administered by the endoscopy nurse       and  supervised by the endoscopist. The following parameters were       monitored: oxygen saturation, heart rate, blood pressure, CO2       capnography and response to care. Total physician intraservice time was       13 minutes. Recommendation:           - Patient has a contact number available for                            emergencies. The signs and symptoms of potential                            delayed complications were discussed with the                            patient. Return to normal activities tomorrow.                            Written discharge instructions were provided to the                            patient.                           - Resume previous diet today.                           - Continue present medications.                           - Use sucralfate tablets 1 gram PO QID.                           -  Return to GI clinic in 3 months.                           - Repeat upper endoscopy in 6 months. Procedure Code(s):        --- Professional ---                           [phone], Esophagogastroduodenoscopy, flexible,                            transoral; diagnostic, including collection of                            specimen(s) by brushing or washing, when performed                            (separate procedure)                           G0500, Moderate sedation services provided by the                            same physician or other qualified health care                            professional performing a gastrointestinal                            endoscopic service that sedation supports,                            requiring the presence of an independent trained                            observer to assist in the monitoring of the                            patient's level of consciousness and physiological                            status; initial 15 minutes of intra-service time;                            patient age 36 years or older (additional time  may                            be reported with [phone], as appropriate) Diagnosis Code(s):        --- Professional ---                           I85.00, Esophageal varices without bleeding                           K76.6, Portal hypertension  K31.89, Other diseases of stomach and duodenum CPT copyright 2019 American [employer] Association. All rights reserved. The codes documented in this report are preliminary and upon coder review may  be revised to meet current compliance requirements. Hildred Laser, MD Hildred Laser, MD 05/24/2019 8:12:17 AM This report has been signed electronically. Number of Addenda: 0

## 2019-05-24 NOTE — H&P (Signed)
Jeremy Chavez is an 68 y.o. male.   Chief Complaint: Patient is here for esophagogastroduodenoscopy with possible esophageal variceal banding. HPI: Patient is 68 year old Caucasian male who has cirrhosis secondary to NASH who is undergoing esophagogastroduodenoscopy to reevaluate esophageal varices.  If they have enlarged this will be banded today.  Last exam was in May 2019 and he was also found to have gastric ulcers.  Varices were not large enough to be banded.  He denies abdominal pain nausea vomiting or melena.  He has been evaluated at [facility] for liver transplant.  He has an appointment next week. He is on lactulose for hepatic encephalopathy. Patient does not take aspirin or NSAIDs.  Last ultrasound was at [facility] in February 2020.  Past Medical History:  Diagnosis Date  . Diabetes ([place])   . Hypothyroid   . NAFLD (nonalcoholic fatty liver disease)     Past Surgical History:  Procedure Laterality Date  . carpal tunnel rt hand    . ESOPHAGOGASTRODUODENOSCOPY N/A 02/10/2018   Procedure: ESOPHAGOGASTRODUODENOSCOPY (EGD);  Surgeon: Rogene Houston, MD;  [facility];  Service: Endoscopy;  Laterality: N/A;  9:45  . umblical hernia repair      Family History  Problem Relation Age of Onset  . Liver cancer Father    Social History:  reports that he has quit smoking. He has never used smokeless tobacco. He reports that he does not drink alcohol or use drugs.  Allergies: No Known Allergies  Medications Prior to Admission  Medication Sig Dispense Refill  . ALKA-SELTZER PLUS ALLERGY 25 MG tablet Take 25 mg by mouth every 6 (six) hours as needed (sinus/allergies).    . ferrous sulfate 325 (65 FE) MG tablet Take 325 mg by mouth 2 (two) times daily.    . furosemide (LASIX) 20 MG tablet Take 40 mg by mouth daily after breakfast.     . lactulose (CHRONULAC) 10 GM/15ML solution Take 30 g by mouth 4 (four) times daily.     Marland Kitchen levothyroxine (EUTHYROX) 100 MCG tablet Take 100 mcg by mouth  daily before breakfast.    . lisinopril-hydrochlorothiazide (PRINZIDE,ZESTORETIC) 20-25 MG tablet Take 1 tablet by mouth every evening.     . pantoprazole (PROTONIX) 40 MG tablet Take 40 mg by mouth 2 (two) times daily before a meal.    . spironolactone (ALDACTONE) 50 MG tablet Take 50 mg by mouth daily.      No results found for this or any previous visit (from the past 48 hour(s)). No results found.  ROS  Blood pressure 119/65, pulse 83, temperature 98.2 F (36.8 C), temperature source Oral, resp. rate 11, height 6' 1"  (1.854 m), weight 99.8 kg, SpO2 99 %. Physical Exam  Constitutional: He appears well-developed and well-nourished.  HENT:  Mouth/Throat: Oropharynx is clear and moist.  Eyes: Conjunctivae are normal. No scleral icterus.  Neck: No thyromegaly present.  Cardiovascular: Normal rate, regular rhythm and normal heart sounds.  No murmur heard. GI:  Abdomen is is full but not tense.  On palpation is soft and nontender.  Spleen is not palpable.  Musculoskeletal:        General: No edema.  Lymphadenopathy:    He has no cervical adenopathy.  Neurological: He is alert.  He does not have asterixis.  Skin: Skin is warm and dry.     Assessment/Plan Cirrhosis secondary to NASH with portal hypertension. Esophagogastroduodenoscopy to evaluate and treat esophageal varices.  Hildred Laser, MD 05/24/2019, 7:29 AM

## 2019-05-24 NOTE — Discharge Instructions (Signed)
Sucralfate 1 g by mouth 30 to 60  minutes before each meal and at bedtime. Resume other medications as before. Resume usual diet. No driving for 24 hours. Office visit in 3 months.   Upper Endoscopy, Adult, Care After This sheet gives you information about how to care for yourself after your procedure. Your health care provider may also give you more specific instructions. If you have problems or questions, contact your health care provider. What can I expect after the procedure? After the procedure, it is common to have:  A sore throat.  Mild stomach pain or discomfort.  Bloating.  Nausea. Follow these instructions at home:   Follow instructions from your health care provider about what to eat or drink after your procedure.  Return to your normal activities as told by your health care provider. Ask your health care provider what activities are safe for you.  Take over-the-counter and prescription medicines only as told by your health care provider.  Do not drive for 24 hours if you were given a sedative during your procedure.  Keep all follow-up visits as told by your health care provider. This is important. Contact a health care provider if you have:  A sore throat that lasts longer than one day.  Trouble swallowing. Get help right away if:  You vomit blood or your vomit looks like coffee grounds.  You have: ? A fever. ? Bloody, black, or tarry stools. ? A severe sore throat or you cannot swallow. ? Difficulty breathing. ? Severe pain in your chest or abdomen. Summary  After the procedure, it is common to have a sore throat, mild stomach discomfort, bloating, and nausea.  Do not drive for 24 hours if you were given a sedative during the procedure.  Follow instructions from your health care provider about what to eat or drink after your procedure.  Return to your normal activities as told by your health care provider. This information is not intended to replace  advice given to you by your health care provider. Make sure you discuss any questions you have with your health care provider. Document Released: 03/07/2012 Document Revised: 02/28/2018 Document Reviewed: 02/06/2018 Elsevier Patient Education  2020 [employer].

## 2019-05-30 ENCOUNTER — Encounter (HOSPITAL_COMMUNITY): Payer: Self-pay | Admitting: Internal Medicine

## 2019-05-30 DIAGNOSIS — K7290 Hepatic failure, unspecified without coma: Secondary | ICD-10-CM | POA: Diagnosis not present

## 2019-05-30 DIAGNOSIS — Z299 Encounter for prophylactic measures, unspecified: Secondary | ICD-10-CM | POA: Diagnosis not present

## 2019-05-30 DIAGNOSIS — J309 Allergic rhinitis, unspecified: Secondary | ICD-10-CM | POA: Diagnosis not present

## 2019-05-30 DIAGNOSIS — K279 Peptic ulcer, site unspecified, unspecified as acute or chronic, without hemorrhage or perforation: Secondary | ICD-10-CM | POA: Diagnosis not present

## 2019-05-30 DIAGNOSIS — Z87891 Personal history of nicotine dependence: Secondary | ICD-10-CM | POA: Diagnosis not present

## 2019-05-30 DIAGNOSIS — R935 Abnormal findings on diagnostic imaging of other abdominal regions, including retroperitoneum: Secondary | ICD-10-CM | POA: Diagnosis not present

## 2019-05-30 DIAGNOSIS — Z6830 Body mass index (BMI) 30.0-30.9, adult: Secondary | ICD-10-CM | POA: Diagnosis not present

## 2019-05-30 DIAGNOSIS — I8510 Secondary esophageal varices without bleeding: Secondary | ICD-10-CM | POA: Diagnosis not present

## 2019-05-30 DIAGNOSIS — K766 Portal hypertension: Secondary | ICD-10-CM | POA: Diagnosis not present

## 2019-05-30 DIAGNOSIS — K7581 Nonalcoholic steatohepatitis (NASH): Secondary | ICD-10-CM | POA: Diagnosis not present

## 2019-05-30 DIAGNOSIS — D638 Anemia in other chronic diseases classified elsewhere: Secondary | ICD-10-CM | POA: Diagnosis not present

## 2019-05-30 DIAGNOSIS — I10 Essential (primary) hypertension: Secondary | ICD-10-CM | POA: Diagnosis not present

## 2019-05-30 DIAGNOSIS — K7460 Unspecified cirrhosis of liver: Secondary | ICD-10-CM | POA: Diagnosis not present

## 2019-05-30 DIAGNOSIS — E871 Hypo-osmolality and hyponatremia: Secondary | ICD-10-CM | POA: Diagnosis not present

## 2019-06-04 DIAGNOSIS — H5200 Hypermetropia, unspecified eye: Secondary | ICD-10-CM | POA: Diagnosis not present

## 2019-06-21 ENCOUNTER — Encounter (INDEPENDENT_AMBULATORY_CARE_PROVIDER_SITE_OTHER): Payer: Self-pay | Admitting: Nurse Practitioner

## 2019-06-21 ENCOUNTER — Ambulatory Visit (INDEPENDENT_AMBULATORY_CARE_PROVIDER_SITE_OTHER): Payer: Medicare HMO | Admitting: Nurse Practitioner

## 2019-06-21 ENCOUNTER — Other Ambulatory Visit: Payer: Self-pay

## 2019-06-21 VITALS — BP 105/64 | HR 73 | Temp 97.9°F | Ht 73.0 in | Wt 236.7 lb

## 2019-06-21 DIAGNOSIS — K7460 Unspecified cirrhosis of liver: Secondary | ICD-10-CM | POA: Diagnosis not present

## 2019-06-21 NOTE — Patient Instructions (Addendum)
1. Reduce  Your Lactulose to three times daily. Monitor your bowel movement frequency. Call [facility] in 1 week with an update and to verify how many bowel movements you are having in one day.  2. Continue taking Lasix and Spironolactone as previously prescribed   3. Complete the lab tests today  4. Follow up in our office in 3 months

## 2019-06-21 NOTE — Progress Notes (Signed)
Subjective:    Patient ID: Jeremy Chavez, male    DOB: Sep 27, 1950, 68 y.o.   MRN: [id number]  HPI Daquan Crapps is a 68 year old male with a past medical history significant for Karlene Lineman cirrhosis without ascites initially diagnosed in 2015, esophageal varices, gastric ulcers and hepatic encephalopathy.  He presents today for follow up. He denies having any upper or lower abdominal pain. He is passing 7 to 8 loose to watery stool daily. He is taking Lactulose QID. No rectal bleeding or melena. No Nausea or vomiting. His appetite is good. He has gained 5lbs over the past 2 months. He complains of itchiness to his arms, legs and back. No jaundice. He is urinating normal yellow colored urine. No NSAIDS. No alcohol. He underwent a surveillance EGD by Dr. Laural Golden on 05/24/2019 which identified grade I esophageal varices and portal hypertensive gastropathy and gastric erosions. He was placed on Carafate 1gm po qid. He was seen by Rema Fendt NP at [facility] liver transplant center 05/30/2019. Labs and abdominal sono results below. His MELD score was 14. He does not meet the criteria for a liver transplant. He was hospitalized for hepatic encephalopathy 03/2019 which he attributes to having an adverse response to a change in his lactulose which was green in color and tasted like coconut. No denies having any further confusion since then. His sister died at the age of 46 from NASH cirrhosis. Another sister has a fatty liver without cirrhosis.    Labs 05/30/2019: AFP 5.5. WBC 4.1. Hg 9.8.  HCT 27.8. PLT 40.  Na+ 134. K 3.5. Cr 1.4. AST 45. ALT 33. T. Bili 2.5. Albumin 3.6. INR 1.1.  Abd sono 05/30/2019:  Findings: Liver doppler ultrasound  The liver is coarsened in echogenicity and demonstrates nodular contour. No gallstones. Gallbladder wall thickness measures 0.4-0.5 cm. Sonographic Murphy's sign is absent. The common bile duct is normal in caliber measuring 0.3 cm. No intrahepatic bile duct dilatation is  identified.  Visualized portion of the pancreas appears normal. No ascites is present. The right kidney measures 13.4 cm. There is no right hydronephrosis.  The left kidney measures 11.9 cm. There is no left hydronephrosis.  The spleen is enlarged and measures 15.9 cm. The splenic artery and splenic vein patent. Large portosystemic collateral vessels visualized adjacent to the left kidney. Abdominal Doppler: 1. Blood flow in the normal direction in the major hepatic blood vessel. 2. Cirrhosis with findings of portal venous hypertension. Slow antegrade flow within the portal vein. 3. Mild thickening of the gallbladder wall is nonspecific and may be secondary to hepatocellular disease.   EGD 05/24/2019: - Grade I esophageal varices. Varices have not enlarged since EGD of May, 2019. - Z-line regular, 45 cm from the incisors. - Portal hypertensive gastropathy with erosions at antrum;some covered with clots. - Erosive gastropathy. - Normal duodenal bulb. - Congested duodenal mucosa. - No specimens collected  EGD 10/07/2014 by Dr. Tonna Corner: Pediatric endoscope introduced into the esophagus, manipulated through the stomach, pylorus, duodenal bulb, and first portion of duodenum. The duodenum revealed some duodenitis with erythematous patches. Stomach itself revealed antral gastritis with erosions. Biopsies were taken and will be checked for Helicobacter. There was mild reflux esophagitis at the EG junction.   Colonoscopy 10/07/2014 by Dr. Tonna Corner: Pediatric colonoscope was introduced into the rectum and manipulated up to the cecum. Bowel prep was not good. He had diverticulosis of the left colon. He had some prominent erythematous blushy type vessels in the cecum. Nothing was  bleeding. I did not really think these were AV malformations other than just prominent blood vessels   Past Medical History:  Diagnosis Date  . Diabetes ([place])   . Hypothyroid   . NAFLD (nonalcoholic  fatty liver disease)     Review of Systems See HPI, all other systems reviewed and are negative     Objective:   Physical Exam BP 105/64   Pulse 73   Temp 97.9 F (36.6 C) (Oral)   Ht 6\' 1"  (1.854 m)   Wt 236 lb 11.2 oz (107.4 kg)   BMI 31.23 kg/m  General: 68 year old male in NAD Eyes: sclera nonicteric, conjunctiva pink Neck: supple, no lymphadenopathy or thyromegaly Heart: RRR, no murmur Lungs: clear throughout Abdomen: protuberant but soft, no obvious ascites, nontender, + BS x 4 quads, no HSM Extremities: no edema, left arm bony deformity Neuro: alert and oriented x 4, no focal deficits, speech is clear, he answers questions appropriately, no asterixis.     Assessment & Plan:   1. 68 y.o. male with NASH Cirrhosis without ascites. MELD 14. MELD Na 17. Patient is  followed by [place]. -follow up at [facility] in 3 months -continue 2gm low sodium diet -continue Furosemide 40mg  QD and Spironolactone 50mg  QD  2. Esophageal Varices, nonbleeding. -Dr. Laural Golden to verify when next surveillance EGD due  3. Anemia secondary to cirrhosis, ? GAVE, gastric erosions -if anemia does not improve patient may require EGD at [facility] for argon laser therapy to minimize bleeding from GAVE -continue Ferrous Sulfate 325mg  bid -continue Carafate 1gm qid -continue Pantoprazole 40mg  bid -repeat CBC in 1 week   4. Thrombocytopenia secondary to cirrhosis/splenomegaly. PLT 40.   5. Hepatic encephalopathy. Patient was admitted to the hospital 03/2019 with hepatic encephalopathy without recurrence. Lactulose 10gm/94ml 45 ml qid. Xifaxan was not affordable and it caused constipation -Lactulose reduce to tid as patient is having 7 - 8 loose to water bowel movements daily -repeat BMP in 1 week  6. Colon cancer screening. Colonoscopy 10/07/2014 no polyps, prep was not good -I would recommend a repeat colonoscopy by 2021, further recommendations per Dr. Laural Golden   Follow up in office in 3  months

## 2019-06-22 ENCOUNTER — Ambulatory Visit (INDEPENDENT_AMBULATORY_CARE_PROVIDER_SITE_OTHER): Payer: Medicare HMO | Admitting: "Endocrinology

## 2019-06-22 ENCOUNTER — Encounter: Payer: Self-pay | Admitting: "Endocrinology

## 2019-06-22 DIAGNOSIS — E039 Hypothyroidism, unspecified: Secondary | ICD-10-CM | POA: Diagnosis not present

## 2019-06-22 NOTE — Progress Notes (Signed)
Endocrinology Consult Note       06/22/2019, 1:22 PM  Jeremy Chavez is a 68 y.o.-year-old male, referred by his  Glenda Chroman, MD  , for evaluation for hypercalcemia/hyperparathyroidism.   Past Medical History:  Diagnosis Date  . Diabetes ([place])   . Hypothyroid   . NAFLD (nonalcoholic fatty liver disease)     Past Surgical History:  Procedure Laterality Date  . carpal tunnel rt hand    . ESOPHAGOGASTRODUODENOSCOPY N/A 02/10/2018   Procedure: ESOPHAGOGASTRODUODENOSCOPY (EGD);  Surgeon: Rogene Houston, MD;  [facility];  Service: Endoscopy;  Laterality: N/A;  2:10  . ESOPHAGOGASTRODUODENOSCOPY N/A 05/24/2019   Procedure: ESOPHAGOGASTRODUODENOSCOPY (EGD);  Surgeon: Rogene Houston, MD;  Location[facility];  Service: Endoscopy;  Laterality: N/A;  932  . umblical hernia repair      Social History   Tobacco Use  . Smoking status: Former Smoker    Packs/day: 1.50    Types: Cigarettes    Quit date: 1999    Years since quitting: 21.7  . Smokeless tobacco: Never Used  Substance Use Topics  . Alcohol use: No    Frequency: Never  . Drug use: No    Family History  Problem Relation Age of Onset  . Liver cancer Father     Outpatient Encounter Medications as of 06/22/2019  Medication Sig  . ferrous sulfate 325 (65 FE) MG tablet Take 325 mg by mouth 2 (two) times daily.  . furosemide (LASIX) 20 MG tablet Take 40 mg by mouth daily after breakfast.   . lactulose (CHRONULAC) 10 GM/15ML solution Take 30 g by mouth 4 (four) times daily.   Marland Kitchen levothyroxine (EUTHYROX) 100 MCG tablet Take 100 mcg by mouth daily before breakfast.  . lisinopril-hydrochlorothiazide (PRINZIDE,ZESTORETIC) 20-25 MG tablet Take 1 tablet by mouth every evening.   . pantoprazole (PROTONIX) 40 MG tablet Take 40 mg by mouth 2 (two) times daily before a meal.  . sucralfate (CARAFATE) 1 g tablet Take 1 tablet (1 g total) by mouth 4 (four) times  daily -  with meals and at bedtime.  Marland Kitchen spironolactone (ALDACTONE) 50 MG tablet Take 50 mg by mouth daily.   No facility-administered encounter medications on file as of 06/22/2019.     Allergies  Allergen Reactions  . Lactulose Nausea And Vomiting and Other (See Comments)    Patient states that pharmacy gave him a new one that was green in color , and tasted like coconut. Cannot recall the brand. He did get the original one and is toleraing well.       HPI  Jeremy Chavez was diagnosed with hypercalcemia at least from July 2020 .  Patient has no previously known history of parathyroid, pituitary, adrenal dysfunctions; no family history of such dysfunctions. -Review of hisreferral package of most recent labs reveals calcium of 11.2 , with no corresponding PTH.  No prior history of fragility fractures or falls. No history of  kidney stones.  No history of CKD. Last BUN/Cr: 14/0.98  he is  on HCTZ , but not on any calcium supplements.    No history of  vitamin D deficiency.  He  does not have recent measurement of 25-hydroxy vitamin D.  -  he eats dairy and green, leafy, vegetables on average amounts.  he does not have a family history of hypercalcemia, pituitary tumors, thyroid cancer, or osteoporosis.  -He has a diagnosis of hypothyroidism on levothyroxine currently 100 mcg once good compliance.  He has taken this medication at various doses for the last 10 years. -He has cirrhosis following with GI specialists in [place]. ROS:  Constitutional: no weight gain/loss, no fatigue, no subjective hyperthermia, no subjective hypothermia Eyes: no blurry vision, no xerophthalmia ENT: no sore throat, no nodules palpated in throat, no dysphagia/odynophagia, no hoarseness Cardiovascular: no Chest Pain, no Shortness of Breath, no palpitations, no leg swelling Respiratory: no cough, no shortness of breath  Gastrointestinal: no Nausea/Vomiting/Diarhhea Musculoskeletal: no muscle/joint aches Skin:  no rashes Neurological: no tremors, no numbness, no tingling, no dizziness Psychiatric: no depression, no anxiety  PE: BP 103/61   Pulse 77   Ht 6\' 1"  (1.854 m)   Wt 239 lb (108.4 kg)   BMI 31.53 kg/m , Body mass index is 31.53 kg/m. Wt Readings from Last 3 Encounters:  06/22/19 239 lb (108.4 kg)  06/21/19 236 lb 11.2 oz (107.4 kg)  05/24/19 220 lb (99.8 kg)    Constitutional:   BMI of 31.5, not in acute distress, normal state of mind Eyes: PERRLA, EOMI, no exophthalmos ENT: moist mucous membranes, no gross thyromegaly, no gross cervical lymphadenopathy Cardiovascular: normal precordial activity, Regular Rate and Rhythm, no Murmur/Rubs/Gallops Respiratory:  adequate breathing efforts, no gross chest deformity, Clear to auscultation bilaterally Gastrointestinal: abdomen soft, Non -tender, No distension, Bowel Sounds present Musculoskeletal: no gross deformities, strength intact in all four extremities Skin: moist, warm, no rashes Neurological: no tremor with outstretched hands, Deep tendon reflexes normal in bilateral lower extremities.     CMP ( most recent) CMP     Component Value Date/Time   NA 138 08/22/2018 1159   K 3.8 08/22/2018 1159   CL 110 08/22/2018 1159   CO2 21 (L) 08/22/2018 1159   GLUCOSE 142 (H) 08/22/2018 1159   BUN 14 08/22/2018 1159   CREATININE 0.98 08/22/2018 1159   CALCIUM 10.6 (H) 08/23/2018 0518   PROT 6.3 02/02/2019 0856   ALBUMIN 3.0 (L) 08/23/2018 0518   AST 34 02/02/2019 0856   ALT 23 02/02/2019 0856   ALKPHOS 116 08/22/2018 1159   BILITOT 2.0 (H) 02/02/2019 0856   GFRNONAA >60 08/22/2018 1159   GFRAA >60 08/22/2018 1159      Lab Results  Component Value Date   TSH 7.90 (A) 03/29/2019   TSH 0.304 (L) 08/22/2018      Assessment: 1. Hypercalcemia / Hyperparathyroidism 2.  Hypothyroidism  Plan: Patient has had several instances of elevated calcium, with the highest level being at 11.5 mg per DL on April 30, 2019. -He does not  have a corresponding PTH, phosphorus, magnesium to review.  He does not have 25-hydroxy vitamin D measurement to review.   - No apparent complications from hypercalcemia/hyperparathyroidism: no history of  nephrolithiasis,  osteoporosis,fragility fractures. No abdominal pain, no major mood disorders, no bone pain.  - I discussed with the patient about the physiology of calcium and parathyroid hormone, and possible  effects of  increased PTH/ Calcium , including kidney stones, cardiac dysrhythmias, osteoporosis, abdominal pain, etc.   - The work up so far is not sufficient to reach a conclusion for definitive therapy.  he  needs more studies to confirm and classify the parathyroid  dysfunction he may have. I will proceed to obtain  repeat intact PTH/calcium, serum magnesium, serum phosphorus, PTH related peptide, vitamin D panel.  It is also essential to obtain 24-hour urine calcium/creatinine to rule out the rare but important cause of mild elevation in calcium and PTH- [place] ( Familial Hypocalciuric Hypercalcemia), which may not require any active intervention.  -Will be considered for DEXA after his next visit  for evaluation of cortical bone, which is predominantly affected by hyperparathyroidism.   -If he is confirmed to have primary hyperparathyroidism, will be considered for parathyroid scan to prepare for surgical therapy.  Regarding his hypothyroidism which he had for at least 10 years, he is advised to continue his current dose of levothyroxine at 100 mcg p.o. daily before breakfast.   - We discussed about the correct intake of his thyroid hormone, on empty stomach at fasting, with water, separated by at least 30 minutes from breakfast and other medications,  and separated by more than 4 hours from calcium, iron, multivitamins, acid reflux medications (PPIs). -Patient is made aware of the fact that thyroid hormone replacement is needed for life, dose to be adjusted by periodic monitoring of  thyroid function tests. He will be considered for baseline thyroid/neck ultrasound given his clinical goiter on examination.  - Time spent with the patient: 45 minutes, of which >50% was spent in obtaining information about his symptoms, reviewing his previous labs, evaluations, and treatments, counseling him about his hypercalcemia, hypothyroidism, clinical goiter, and developing a plan to confirm the diagnosis and long term treatment as necessary.  Please refer to " Patient Self Inventory" in the Media  tab for reviewed elements of pertinent patient history.  Jules Schick participated in the discussions, expressed understanding, and voiced agreement with the above plans.  All questions were answered to his satisfaction. he is encouraged to contact clinic should he have any questions or concerns prior to his return visit.  - Return in about 2 weeks (around 07/06/2019) for Labs Today- Non-Fasting Ok, Thyroid / Neck Ultrasound, 24 Hours Urine Calcium and Creatinine.   Glade Lloyd, MD [facility] [address] [place], [place] [address] Phone: [phone]  Fax: [phone]    This note was partially dictated with voice recognition software. Similar sounding words can be transcribed inadequately or may not  be corrected upon review.  06/22/2019, 1:22 PM

## 2019-06-24 DIAGNOSIS — E039 Hypothyroidism, unspecified: Secondary | ICD-10-CM | POA: Diagnosis not present

## 2019-06-26 LAB — CREATININE, URINE, 24 HOUR: Creatinine, 24H Ur: 1.49 g/(24.h) (ref 0.50–2.15)

## 2019-06-26 LAB — CALCIUM, URINE, 24 HOUR: Calcium, 24H Urine: 40 mg/24 h — ABNORMAL LOW

## 2019-06-28 LAB — PHOSPHORUS: Phosphorus: 2.2 mg/dL (ref 2.1–4.3)

## 2019-06-28 LAB — TSH: TSH: 5.61 mIU/L — ABNORMAL HIGH (ref 0.40–4.50)

## 2019-06-28 LAB — MAGNESIUM: Magnesium: 1.6 mg/dL (ref 1.5–2.5)

## 2019-06-28 LAB — PTH-RELATED PEPTIDE: PTH-Related Protein (PTH-RP): 14 pg/mL (ref 14–27)

## 2019-06-28 LAB — PTH, INTACT AND CALCIUM
Calcium: 10.5 mg/dL — ABNORMAL HIGH (ref 8.6–10.3)
PTH: 66 pg/mL — ABNORMAL HIGH (ref 14–64)

## 2019-06-28 LAB — T4, FREE: Free T4: 1 ng/dL (ref 0.8–1.8)

## 2019-07-02 ENCOUNTER — Ambulatory Visit (HOSPITAL_COMMUNITY): Payer: Medicare HMO

## 2019-07-05 ENCOUNTER — Ambulatory Visit (HOSPITAL_COMMUNITY)
Admission: RE | Admit: 2019-07-05 | Discharge: 2019-07-05 | Disposition: A | Discharge status: Home / Self Care | Payer: Medicare HMO | Source: Ambulatory Visit | Attending: "Endocrinology | Admitting: "Endocrinology

## 2019-07-05 ENCOUNTER — Telehealth (INDEPENDENT_AMBULATORY_CARE_PROVIDER_SITE_OTHER): Payer: Self-pay | Admitting: Nurse Practitioner

## 2019-07-05 ENCOUNTER — Other Ambulatory Visit: Payer: Self-pay

## 2019-07-05 DIAGNOSIS — E039 Hypothyroidism, unspecified: Secondary | ICD-10-CM | POA: Diagnosis not present

## 2019-07-05 NOTE — Telephone Encounter (Signed)
Patient came by office stated he went for a 2nd time to have labs drawn and was told he had a fever again of 100

## 2019-07-06 ENCOUNTER — Encounter: Payer: Self-pay | Admitting: "Endocrinology

## 2019-07-06 ENCOUNTER — Ambulatory Visit (INDEPENDENT_AMBULATORY_CARE_PROVIDER_SITE_OTHER): Payer: Medicare HMO | Admitting: "Endocrinology

## 2019-07-06 DIAGNOSIS — E039 Hypothyroidism, unspecified: Secondary | ICD-10-CM

## 2019-07-06 MED ORDER — LEVOTHYROXINE SODIUM 125 MCG PO TABS: 125.0000 ug | ORAL_TABLET | Freq: Every day | ORAL | 1 refills | Status: DC

## 2019-07-06 MED ORDER — VITAMIN D3 125 MCG (5000 UT) PO CAPS: 5000.0000 [IU] | ORAL_CAPSULE | Freq: Every day | ORAL | 1 refills | Status: AC

## 2019-07-06 NOTE — Progress Notes (Signed)
07/06/2019, 12:32 PM                                Endocrinology Telehealth Visit Follow up Note -During COVID -19 Pandemic  I connected with Jeremy Chavez on 07/06/2019   by telephone and verified that I am speaking with the correct person using two identifiers. Jeremy Chavez, 1951/08/29. he has verbally consented to this visit. All issues noted in this document were discussed and addressed. The format was not optimal for physical exam.  Jeremy Chavez is a 68 y.o.-year-old male, referred by his  Glenda Chroman, MD  , for evaluation for hypercalcemia/hyperparathyroidism.   Past Medical History:  Diagnosis Date  . Diabetes ([place])   . Hypothyroid   . NAFLD (nonalcoholic fatty liver disease)     Past Surgical History:  Procedure Laterality Date  . carpal tunnel rt hand    . ESOPHAGOGASTRODUODENOSCOPY N/A 02/10/2018   Procedure: ESOPHAGOGASTRODUODENOSCOPY (EGD);  Surgeon: Rogene Houston, MD;  [facility];  Service: Endoscopy;  Laterality: N/A;  2:10  . ESOPHAGOGASTRODUODENOSCOPY N/A 05/24/2019   Procedure: ESOPHAGOGASTRODUODENOSCOPY (EGD);  Surgeon: Rogene Houston, MD;  Location: [facility];  Service: Endoscopy;  Laterality: N/A;  161  . umblical hernia repair      Social History   Tobacco Use  . Smoking status: Former Smoker    Packs/day: 1.50    Types: Cigarettes    Quit date: 1999    Years since quitting: 21.8  . Smokeless tobacco: Never Used  Substance Use Topics  . Alcohol use: No    Frequency: Never  . Drug use: No    Family History  Problem Relation Age of Onset  . Liver cancer Father     Outpatient Encounter Medications as of 07/06/2019  Medication Sig  . Cholecalciferol (VITAMIN D3) 125 MCG (5000 UT) CAPS Take 1 capsule (5,000 Units total) by mouth daily.  . ferrous sulfate 325 (65 FE) MG tablet Take 325 mg by mouth 2 (two) times daily.  . furosemide (LASIX) 20 MG tablet Take  40 mg by mouth daily after breakfast.   . lactulose (CHRONULAC) 10 GM/15ML solution Take 30 g by mouth 4 (four) times daily.   Marland Kitchen levothyroxine (SYNTHROID) 125 MCG tablet Take 1 tablet (125 mcg total) by mouth daily before breakfast.  . lisinopril-hydrochlorothiazide (PRINZIDE,ZESTORETIC) 20-25 MG tablet Take 1 tablet by mouth every evening.   . pantoprazole (PROTONIX) 40 MG tablet Take 40 mg by mouth 2 (two) times daily before a meal.  . spironolactone (ALDACTONE) 50 MG tablet Take 50 mg by mouth daily.  . sucralfate (CARAFATE) 1 g tablet Take 1 tablet (1 g total) by mouth 4 (four) times daily -  with meals and at bedtime.  . [DISCONTINUED] levothyroxine (EUTHYROX) 100 MCG tablet Take 100 mcg by mouth daily before breakfast.   No facility-administered encounter medications on file as of 07/06/2019.     Allergies  Allergen Reactions  . Lactulose Nausea And Vomiting and Other (See Comments)    Patient states that pharmacy gave him a  new one that was green in color , and tasted like coconut. Cannot recall the brand. He did get the original one and is toleraing well.       HPI  Jeremy Chavez was diagnosed with hypercalcemia at least from July 2020 .  Patient has no previously known history of parathyroid, pituitary, adrenal dysfunctions; no family history of such dysfunctions. -He is on treatment for hypothyroidism. -He was seen with hypercalcemia of 11.2 mg per DL, repeat labs show improved calcium of 10.5 associated with slightly elevated PTH of 66.  He has normal PTH RP, 24-hour urine calcium was minimal at 40 mg / 24 hours.   No prior history of fragility fractures or falls. No history of  kidney stones.  No history of CKD. Last BUN/Cr: 14/0.98  he is  on HCTZ , but not on any calcium supplements.  He is not on vitamin D supplement.  He has no new complaints. -His diet is out of average.  he does not have a family history of hypercalcemia, pituitary tumors, thyroid cancer, or  osteoporosis.  -He has a diagnosis of hypothyroidism on levothyroxine currently 100 mcg once good compliance.  His previsit thyroid function tests are consistent with inadequate replacement. -He has cirrhosis following with GI specialists in [place].  ROS: Limited as above.  PE: There were no vitals taken for this visit., There is no height or weight on file to calculate BMI. Wt Readings from Last 3 Encounters:  06/22/19 239 lb (108.4 kg)  06/21/19 236 lb 11.2 oz (107.4 kg)  05/24/19 220 lb (99.8 kg)    CMP ( most recent) CMP     Component Value Date/Time   NA 138 08/22/2018 1159   K 3.8 08/22/2018 1159   CL 110 08/22/2018 1159   CO2 21 (L) 08/22/2018 1159   GLUCOSE 142 (H) 08/22/2018 1159   BUN 14 08/22/2018 1159   CREATININE 0.98 08/22/2018 1159   CALCIUM 10.5 (H) 06/22/2019 0949   PROT 6.3 02/02/2019 0856   ALBUMIN 3.0 (L) 08/23/2018 0518   AST 34 02/02/2019 0856   ALT 23 02/02/2019 0856   ALKPHOS 116 08/22/2018 1159   BILITOT 2.0 (H) 02/02/2019 0856   GFRNONAA >60 08/22/2018 1159   GFRAA >60 08/22/2018 1159      Lab Results  Component Value Date   TSH 5.61 (H) 06/22/2019   TSH 7.90 (A) 03/29/2019   TSH 0.304 (L) 08/22/2018   FREET4 1.0 06/22/2019    Recent Results (from the past 2160 hour(s))  SARS CORONAVIRUS 2 (TAT 6-24 HRS) Nasopharyngeal Nasopharyngeal Swab     Status: None   Collection Time: 05/22/19  7:02 AM   Specimen: Nasopharyngeal Swab  Result Value Ref Range   SARS Coronavirus 2 NEGATIVE NEGATIVE    Comment: (NOTE) SARS-CoV-2 target nucleic acids are NOT DETECTED. The SARS-CoV-2 RNA is generally detectable in upper and lower respiratory specimens during the acute phase of infection. Negative results do not preclude SARS-CoV-2 infection, do not rule out co-infections with other pathogens, and should not be used as the sole basis for treatment or other patient management decisions. Negative results must be combined with clinical  observations, patient history, and epidemiological information. The expected result is Negative. Fact Sheet for Patients: SugarRoll.be Fact Sheet for Healthcare Providers: https://www.woods-mathews.com/ This test is not yet approved or cleared by the [place] FDA and  has been authorized for detection and/or diagnosis of SARS-CoV-2 by FDA under an Emergency Use Authorization (EUA). This EUA will remain  in effect (meaning this test can be used) for the duration of the COVID-19 declaration under Section 56 4(b)(1) of the Act, 21 U.S.C. section 360bbb-3(b)(1), unless the authorization is terminated or revoked sooner. Performed at [facility], [place] [address] [place], [place] [address]   TSH     Status: Abnormal   Collection Time: 06/22/19  9:49 AM  Result Value Ref Range   TSH 5.61 (H) 0.40 - 4.50 mIU/L  T4, free     Status: None   Collection Time: 06/22/19  9:49 AM  Result Value Ref Range   Free T4 1.0 0.8 - 1.8 ng/dL  Phosphorus     Status: None   Collection Time: 06/22/19  9:49 AM  Result Value Ref Range   Phosphorus 2.2 2.1 - 4.3 mg/dL  Magnesium     Status: None   Collection Time: 06/22/19  9:49 AM  Result Value Ref Range   Magnesium 1.6 1.5 - 2.5 mg/dL  PTH, intact and calcium     Status: Abnormal   Collection Time: 06/22/19  9:49 AM  Result Value Ref Range   PTH 66 (H) 14 - 64 pg/mL    Comment: . Interpretive Guide    Intact PTH           Calcium ------------------    ----------           ------- Normal Parathyroid    Normal               Normal Hypoparathyroidism    Low or Low Normal    Low Hyperparathyroidism    Primary            Normal or High       High    Secondary          High                 Normal or Low    Tertiary           High                 High Non-Parathyroid    Hypercalcemia      Low or Low Normal    High .    Calcium 10.5 (H) 8.6 - 10.3 mg/dL  PTH-related peptide     Status: None    Collection Time: 06/22/19  9:49 AM  Result Value Ref Range   PTH-Related Protein (PTH-RP) 14 14 - 27 pg/mL    Comment: . This is a C-terminal PTH-RP assay. PTH-RP is useful in the differential diagnosis of hypercalcemia and levels may be elevated in patients with tumor-associated hypercalcemia. Elevated results may also be observed in patients with renal disease. . This test was developed and its analytical performance characteristics have been determined by [facility]. It has not been cleared or approved by FDA. This assay has been validated pursuant to the CLIA regulations and is used for clinical purposes.   Creatinine, urine, 24 hour     Status: None   Collection Time: 06/24/19  9:07 AM  Result Value Ref Range   Creatinine, 24H Ur 1.49 0.50 - 2.15 g/24 h  Calcium, urine, 24 hour     Status: Abnormal   Collection Time: 06/24/19  9:07 AM  Result Value Ref Range   Calcium, 24H Urine 40 (L) mg/24 h    Comment:  Reference Range  55-300                            Low calcium diet 55-200      Assessment: 1. Hypercalcemia / Hyperparathyroidism 2.  Hypothyroidism  Plan: Patient has had several instances of elevated calcium, with the highest level being at 11.5 mg per DL on April 30, 2019.,  His repeat previsit labs show that her calcium of 10.5 with mildly elevated PTH of 66.  His vitamin D is pending. -His 24-hour urine calcium is minimal at 40 mg / 24 hours. -He is not a surgical candidate at this time. -He will be initiated on vitamin D3 supplement 5000 units daily for the next 90 days.  -Will be considered for DEXA after his next visit  for evaluation of cortical bone, which is predominantly affected by hyperparathyroidism.    Regarding his hypothyroidism which he had for at least 10 years, his labs are consistent with inadequate replacement.  I discussed and increase his levothyroxine to 125 mcg p.o.  daily before breakfast.     - We discussed about the correct intake of his thyroid hormone, on empty stomach at fasting, with water, separated by at least 30 minutes from breakfast and other medications,  and separated by more than 4 hours from calcium, iron, multivitamins, acid reflux medications (PPIs). -Patient is made aware of the fact that thyroid hormone replacement is needed for life, dose to be adjusted by periodic monitoring of thyroid function tests.  -His thyroid ultrasound is unremarkable, no nodular lesions.  - Patient Care Time Today:  25 min, of which >50% was spent in  counseling and the rest reviewing his  current and  previous labs/studies, his imaging studies, previous treatments,and medications' doses and developing a plan for long-term care based on the latest recommendations for standards of care.   Jeremy Chavez participated in the discussions, expressed understanding, and voiced agreement with the above plans.  All questions were answered to his satisfaction. he is encouraged to contact clinic should he have any questions or concerns prior to his return visit.   - Return in about 6 months (around 01/04/2020) for Follow up with Pre-visit Labs.   Glade Lloyd, MD [facility] [address] [place],  [address] Phone: [phone]  Fax: [phone]    This note was partially dictated with voice recognition software. Similar sounding words can be transcribed inadequately or may not  be corrected upon review.  07/06/2019, 12:32 PM

## 2019-07-06 NOTE — Telephone Encounter (Signed)
Tammy please call patient and let him know that I advise he contact his pcp regarding his recurrent temperature of 100. Pt should have labs done when temp normal. thx

## 2019-07-06 NOTE — Telephone Encounter (Signed)
Patient called and talked with the wife. She states that the patient has no temp at home, at their daughters, or hospital when he went to have [place]. They left the hospital and came straight over th have lab work and the the thermometer read 101.0.  They have called their PCP and made them aware and he advised them to take temp on and off all day and if greater than 102 call them.  At this time the patient has had no temp.Wife also states that the patient has not had any COVID symptoms.

## 2019-08-14 ENCOUNTER — Telehealth (INDEPENDENT_AMBULATORY_CARE_PROVIDER_SITE_OTHER): Payer: Self-pay | Admitting: *Deleted

## 2019-08-14 DIAGNOSIS — I8500 Esophageal varices without bleeding: Secondary | ICD-10-CM

## 2019-08-14 DIAGNOSIS — K7460 Unspecified cirrhosis of liver: Secondary | ICD-10-CM

## 2019-08-14 MED ORDER — PANTOPRAZOLE SODIUM 40 MG PO TBEC: 40.0000 mg | DELAYED_RELEASE_TABLET | Freq: Two times a day (BID) | ORAL | 1 refills | Status: DC

## 2019-08-14 NOTE — Telephone Encounter (Signed)
Rx has been sent to the patient's pharmacy

## 2019-08-29 DIAGNOSIS — K7581 Nonalcoholic steatohepatitis (NASH): Secondary | ICD-10-CM | POA: Diagnosis not present

## 2019-08-29 DIAGNOSIS — R188 Other ascites: Secondary | ICD-10-CM | POA: Diagnosis not present

## 2019-08-29 DIAGNOSIS — K7460 Unspecified cirrhosis of liver: Secondary | ICD-10-CM | POA: Diagnosis not present

## 2019-08-29 DIAGNOSIS — Z1159 Encounter for screening for other viral diseases: Secondary | ICD-10-CM | POA: Diagnosis not present

## 2019-08-29 DIAGNOSIS — K766 Portal hypertension: Secondary | ICD-10-CM | POA: Diagnosis not present

## 2019-08-29 DIAGNOSIS — I8510 Secondary esophageal varices without bleeding: Secondary | ICD-10-CM | POA: Diagnosis not present

## 2019-08-29 DIAGNOSIS — Z87891 Personal history of nicotine dependence: Secondary | ICD-10-CM | POA: Diagnosis not present

## 2019-08-31 DIAGNOSIS — Z2821 Immunization not carried out because of patient refusal: Secondary | ICD-10-CM | POA: Diagnosis not present

## 2019-08-31 DIAGNOSIS — R32 Unspecified urinary incontinence: Secondary | ICD-10-CM | POA: Diagnosis not present

## 2019-08-31 DIAGNOSIS — K7460 Unspecified cirrhosis of liver: Secondary | ICD-10-CM | POA: Diagnosis not present

## 2019-08-31 DIAGNOSIS — M47816 Spondylosis without myelopathy or radiculopathy, lumbar region: Secondary | ICD-10-CM | POA: Diagnosis not present

## 2019-08-31 DIAGNOSIS — I10 Essential (primary) hypertension: Secondary | ICD-10-CM | POA: Diagnosis not present

## 2019-08-31 DIAGNOSIS — K7290 Hepatic failure, unspecified without coma: Secondary | ICD-10-CM | POA: Diagnosis not present

## 2019-08-31 DIAGNOSIS — R3981 Functional urinary incontinence: Secondary | ICD-10-CM | POA: Diagnosis not present

## 2019-09-25 ENCOUNTER — Ambulatory Visit (INDEPENDENT_AMBULATORY_CARE_PROVIDER_SITE_OTHER): Payer: Medicare HMO | Admitting: Internal Medicine

## 2019-09-26 ENCOUNTER — Ambulatory Visit (INDEPENDENT_AMBULATORY_CARE_PROVIDER_SITE_OTHER): Payer: Medicare HMO | Admitting: Nurse Practitioner

## 2019-09-27 ENCOUNTER — Encounter (INDEPENDENT_AMBULATORY_CARE_PROVIDER_SITE_OTHER): Payer: Self-pay | Admitting: Gastroenterology

## 2019-09-27 ENCOUNTER — Other Ambulatory Visit: Payer: Self-pay

## 2019-09-27 ENCOUNTER — Ambulatory Visit (INDEPENDENT_AMBULATORY_CARE_PROVIDER_SITE_OTHER): Payer: Medicare HMO | Admitting: Gastroenterology

## 2019-09-27 VITALS — BP 118/68 | HR 86 | Temp 98.1°F | Ht 73.0 in | Wt 231.6 lb

## 2019-09-27 DIAGNOSIS — D509 Iron deficiency anemia, unspecified: Secondary | ICD-10-CM

## 2019-09-27 DIAGNOSIS — Z1211 Encounter for screening for malignant neoplasm of colon: Secondary | ICD-10-CM | POA: Diagnosis not present

## 2019-09-27 DIAGNOSIS — D696 Thrombocytopenia, unspecified: Secondary | ICD-10-CM | POA: Insufficient documentation

## 2019-09-27 DIAGNOSIS — I8500 Esophageal varices without bleeding: Secondary | ICD-10-CM | POA: Diagnosis not present

## 2019-09-27 DIAGNOSIS — K7460 Unspecified cirrhosis of liver: Secondary | ICD-10-CM | POA: Diagnosis not present

## 2019-09-27 MED ORDER — SUCRALFATE 1 G PO TABS: 1.0000 g | ORAL_TABLET | Freq: Three times a day (TID) | ORAL | 2 refills | Status: DC

## 2019-09-27 NOTE — Patient Instructions (Signed)
We are checking labs today.  I will contact you with results.  I will discuss the medication for itching with Dr. Laural Golden.  We are arranging endoscopy colonoscopy.  Please contact me with any health changes prior

## 2019-09-27 NOTE — Progress Notes (Signed)
Patient profile: Adama Ferber is a 69 y.o. male seen for evaluation of follow up of cirrhosis . Last seen in clinic by Cottie Banda, NP in 06/2019.   History of Present Illness: Hildred Mollica is seen today with medical history of Karlene Lineman cirrhosis with diagnosed in 4010, complicated by esophageal varices, gastric ulcers, hepatic encephalopathy.  He was seen by transplant center at [facility] in September 2020 with a meld score 14.  He feels that overall today he is doing very well.  His only complaint today is some issues with itching, this is worse at night, mainly itching on his back when he is trying to sleep that is preventing him from sleeping.  He has not tried anything like Benadryl over-the-counter.  He has tried lotions and Neosporin.  He is currently taking Lasix 40 mg, he has stopped his Aldactone.  He denies any issues with swelling as long as he elevates his legs.  He tries to avoid salt.  He is currently taking lactulose 30 mL 4 times a day-he typically has 5-6 stools that are urgent but denies any issues with fecal incontinence.  They are loose.  He denies abdominal pain, rectal bleeding, melena.  No upper GI symptoms of nausea, vomiting, GERD, gastric pain.  He is tolerating Carafate 4 times a day after last endoscopy well.   Wt Readings from Last 3 Encounters:  09/27/19 231 lb 9.6 oz (105.1 kg)  06/22/19 239 lb (108.4 kg)  06/21/19 236 lb 11.2 oz (107.4 kg)     Past Medical History:  Past Medical History:  Diagnosis Date  . Diabetes ([place])   . Hypothyroid   . NAFLD (nonalcoholic fatty liver disease)     Problem List: Patient Active Problem List   Diagnosis Date Noted  . Thrombocytopenia ([place]) 09/27/2019  . Esophageal varices without bleeding ([place]) 09/27/2019  . Hypercalcemia 06/22/2019  . Hepatic encephalopathy ([place]) 08/22/2018  . Cirrhosis of liver without ascites ([place]) 01/11/2018  . Hypothyroidism   . Diabetes ([place])   . NAFLD (nonalcoholic fatty liver disease)      Past Surgical History: Past Surgical History:  Procedure Laterality Date  . carpal tunnel rt hand    . ESOPHAGOGASTRODUODENOSCOPY N/A 02/10/2018   Procedure: ESOPHAGOGASTRODUODENOSCOPY (EGD);  Surgeon: Rogene Houston, MD;  [facility];  Service: Endoscopy;  Laterality: N/A;  2:10  . ESOPHAGOGASTRODUODENOSCOPY N/A 05/24/2019   Procedure: ESOPHAGOGASTRODUODENOSCOPY (EGD);  Surgeon: Rogene Houston, MD;  [facility];  Service: Endoscopy;  Laterality: N/A;  272  . umblical hernia repair      Allergies: Allergies  Allergen Reactions  . Lactulose Nausea And Vomiting and Other (See Comments)    Patient states that pharmacy gave him a new one that was green in color , and tasted like coconut. Cannot recall the brand. He did get the original one and is toleraing well.        Home Medications:  Current Outpatient Medications:  .  Cholecalciferol (VITAMIN D3) 125 MCG (5000 UT) CAPS, Take 1 capsule (5,000 Units total) by mouth daily., Disp: 90 capsule, Rfl: 1 .  ferrous sulfate 325 (65 FE) MG tablet, Take 325 mg by mouth 2 (two) times daily., Disp: , Rfl:  .  furosemide (LASIX) 20 MG tablet, Take 40 mg by mouth daily after breakfast. , Disp: , Rfl:  .  lactulose (CHRONULAC) 10 GM/15ML solution, Take 30 g by mouth 4 (four) times daily. , Disp: , Rfl:  .  levothyroxine (SYNTHROID) 125  MCG tablet, Take 1 tablet (125 mcg total) by mouth daily before breakfast., Disp: 90 tablet, Rfl: 1 .  lisinopril-hydrochlorothiazide (PRINZIDE,ZESTORETIC) 20-25 MG tablet, Take 1 tablet by mouth every evening. , Disp: , Rfl:  .  pantoprazole (PROTONIX) 40 MG tablet, Take 1 tablet (40 mg total) by mouth 2 (two) times daily before a meal., Disp: 180 tablet, Rfl: 1 .  sucralfate (CARAFATE) 1 g tablet, Take 1 tablet (1 g total) by mouth 4 (four) times daily -  with meals and at bedtime., Disp: 120 tablet, Rfl: 2 .  spironolactone (ALDACTONE) 50 MG tablet, Take 50 mg by mouth daily., Disp: ,  Rfl:    Family History: family history includes Liver cancer in his father.    Social History:   reports that he quit smoking about 22 years ago. His smoking use included cigarettes. He smoked 1.50 packs per day. He has never used smokeless tobacco. He reports that he does not drink alcohol or use drugs.   Review of Systems: Constitutional: Denies weight loss/weight gain  Eyes: No changes in vision. ENT: No oral lesions, sore throat.  GI: see HPI.  Heme/Lymph: No easy bruising.  CV: No chest pain.  GU: No hematuria.  Integumentary: No rashes.  Neuro: No headaches.  Psych: No depression/anxiety.  Endocrine: No heat/cold intolerance.  Allergic/Immunologic: No urticaria.  Resp: No cough, SOB.  Musculoskeletal: No joint swelling.    Physical Examination: BP 118/68 (BP Location: Right Arm, Patient Position: Sitting, Cuff Size: Large)   Pulse 86   Temp 98.1 F (36.7 C) (Temporal)   Ht 6\' 1"  (1.854 m)   Wt 231 lb 9.6 oz (105.1 kg)   BMI 30.56 kg/m  Gen: NAD, alert and oriented x 4 HEENT: PEERLA, EOMI, Neck: supple, no JVD Chest: CTA bilaterally, no wheezes, crackles, or other adventitious sounds CV: RRR, no m/g/c/r Abd: soft, NT, ND, +BS in all four quadrants; no HSM, guarding, ridigity, or rebound tenderness Ext: no edema, well perfused with 2+ pulses, Skin: some excoriations on back from itching. + easy bruising. Lymph: no noted LAD  Data Reviewed:  05/30/19-Abdominal Doppler: 1. Blood flow in the normal direction in the major hepatic blood vessel. 2. Cirrhosis with findings of portal venous hypertension. Slow antegrade flow within the portal vein. 3. Mild thickening of the gallbladder wall is nonspecific and may be secondary to hepatocellular disease.   Labs 05/30/2019: AFP 5.5. WBC 4.1. Hg 9.8.  HCT 27.8. PLT 40.  Na+ 134. K 3.5. Cr 1.4. AST 45. ALT 33. T. Bili 2.5. Albumin 3.6. INR 1.1.  EGD 05/24/2019: - Grade I esophageal varices. Varices have not enlarged since EGD  of May, 2019. - Z-line regular, 45 cm from the incisors. - Portal hypertensive gastropathy with erosions at antrum;some covered with clots. - Erosive gastropathy. - Normal duodenal bulb. - Congested duodenal mucosa. - No specimens collected   Colonoscopy 10/07/2014 by Dr. Tonna Corner: Pediatric colonoscope was introduced into the rectum and manipulated up to the cecum. Bowel prep was not good. He had diverticulosis of the left colon. He had some prominent erythematous blushy type vessels in the cecum. Nothing was bleeding. I did not really think these were AV malformations other than just prominent blood vessels  Assessment/Plan: Mr. Brenneman is a 69 y.o. male      Chronic anemia - baseline Hgb 9.0 normal MCV    Zhamir was seen today for follow-up.  Diagnoses and all orders for this visit:  Cirrhosis of liver without ascites, unspecified  hepatic cirrhosis type (HCC) -     CBC -     Hepatic function panel -     Basic Metabolic Panel (BMET) -     Procedural/ Surgical Case Request: ESOPHAGOGASTRODUODENOSCOPY (EGD), COLONOSCOPY; Standing -     Procedural/ Surgical Case Request: ESOPHAGOGASTRODUODENOSCOPY (EGD), COLONOSCOPY -     Ammonia -     INR/PT  Thrombocytopenia (HCC) -     CBC -     Hepatic function panel -     Basic Metabolic Panel (BMET) -     Procedural/ Surgical Case Request: ESOPHAGOGASTRODUODENOSCOPY (EGD), COLONOSCOPY; Standing -     Procedural/ Surgical Case Request: ESOPHAGOGASTRODUODENOSCOPY (EGD), COLONOSCOPY -     INR/PT  Esophageal varices without bleeding, unspecified esophageal varices type ([place]) -     Procedural/ Surgical Case Request: ESOPHAGOGASTRODUODENOSCOPY (EGD), COLONOSCOPY; Standing -     Procedural/ Surgical Case Request: ESOPHAGOGASTRODUODENOSCOPY (EGD), COLONOSCOPY -     INR/PT  Personal history of colonic polyps  Other orders -     sucralfate (CARAFATE) 1 g tablet; Take 1 tablet (1 g total) by mouth 4 (four) times daily -  with  meals and at bedtime.    1.  Cirrhosis-due to NAFLD.  Doing well on lactulose, will repeat his ammonia today.  He is on Lasix, he is no longer on Aldactone which was stopped in the past due to hyperkalemia.  We will check a BMP today.  Reviewed to limit salt, elevate lower extremities, no evidence of ascites on exam.  He follows with transplant center at [facility] and he is up-to-date on every 22-month AFP and ultrasound (due 11/2019).  Per Dr. Laural Golden repeat EGD due March 2021, has esophageal varices, also has portal hypertensive gastropathy, chronic history of normocytic anemia and he is on iron twice a day as well as Protonix and Carafate.  We will check a CBC today.  2.  Thrombocytopenia-of note last platelet count 32. Will avoid large polypectomy, etc as at time of colonoscopy.   3. Colon cancer screening-due for repeat colonoscopy March 2021 based on hx poor prep.  4.  Hepatic encephalopathy-stable on current dose of lactulose.  Has been unable to get Xifaxan due to cost and causing constipation.  5. Itching at night -may be due to elevated bili. Initially try benedryl - if no improvement try Atarax.   Patient denies CP, SOB, and use of blood thinners. I discussed the risks and benefits of procedure including bleeding, perforation, infection, missed lesions, medication reactions and possible hospitalization or surgery if complications. All questions answered.   Case discussed w/ Dr Laural Golden.   I personally performed the service, non-incident to. (WP)  Laurine Blazer, [facility]

## 2019-09-28 LAB — CBC
HCT: 26.5 % — ABNORMAL LOW (ref 38.5–50.0)
Hemoglobin: 9.2 g/dL — ABNORMAL LOW (ref 13.2–17.1)
MCH: 32.3 pg (ref 27.0–33.0)
MCHC: 34.7 g/dL (ref 32.0–36.0)
MCV: 93 fL (ref 80.0–100.0)
MPV: 12.3 fL (ref 7.5–12.5)
Platelets: 32 10*3/uL — ABNORMAL LOW (ref 140–400)
RBC: 2.85 10*6/uL — ABNORMAL LOW (ref 4.20–5.80)
RDW: 13.6 % (ref 11.0–15.0)
WBC: 2.8 10*3/uL — ABNORMAL LOW (ref 3.8–10.8)

## 2019-09-28 LAB — BASIC METABOLIC PANEL
BUN/Creatinine Ratio: 11 (calc) (ref 6–22)
BUN: 14 mg/dL (ref 7–25)
CO2: 25 mmol/L (ref 20–32)
Calcium: 10.9 mg/dL — ABNORMAL HIGH (ref 8.6–10.3)
Chloride: 107 mmol/L (ref 98–110)
Creat: 1.32 mg/dL — ABNORMAL HIGH (ref 0.70–1.25)
Glucose, Bld: 121 mg/dL — ABNORMAL HIGH (ref 65–99)
Potassium: 4 mmol/L (ref 3.5–5.3)
Sodium: 139 mmol/L (ref 135–146)

## 2019-09-28 LAB — HEPATIC FUNCTION PANEL
AG Ratio: 1.3 (calc) (ref 1.0–2.5)
ALT: 29 U/L (ref 9–46)
AST: 44 U/L — ABNORMAL HIGH (ref 10–35)
Albumin: 3.5 g/dL — ABNORMAL LOW (ref 3.6–5.1)
Alkaline phosphatase (APISO): 121 U/L (ref 35–144)
Bilirubin, Direct: 0.9 mg/dL — ABNORMAL HIGH (ref 0.0–0.2)
Globulin: 2.7 g/dL (calc) (ref 1.9–3.7)
Indirect Bilirubin: 2.1 mg/dL (calc) — ABNORMAL HIGH (ref 0.2–1.2)
Total Bilirubin: 3 mg/dL — ABNORMAL HIGH (ref 0.2–1.2)
Total Protein: 6.2 g/dL (ref 6.1–8.1)

## 2019-09-28 LAB — PROTIME-INR
INR: 1.2 — ABNORMAL HIGH
Prothrombin Time: 12.5 s — ABNORMAL HIGH (ref 9.0–11.5)

## 2019-09-28 LAB — AMMONIA: Ammonia: 86 umol/L — ABNORMAL HIGH (ref <=72)

## 2019-10-08 ENCOUNTER — Telehealth (INDEPENDENT_AMBULATORY_CARE_PROVIDER_SITE_OTHER): Payer: Self-pay | Admitting: Gastroenterology

## 2019-10-08 DIAGNOSIS — K7460 Unspecified cirrhosis of liver: Secondary | ICD-10-CM

## 2019-10-08 NOTE — Telephone Encounter (Signed)
Discussed with Dr.Rehman.  Given patient is on Lasix we will check a potassium level prior to procedure as well as check platelets given his decreasing platelet on his last lab.  Orders in chart to be done 2-3 days prior to procedure. Thanks

## 2019-10-08 NOTE — Telephone Encounter (Signed)
Case discussed with Dr. Devonne Doughty will need CBC/BMP morning of procedure, his platelets had dropped down on last CBC and he is also on lasix   Ann-can you verify he is with propofol and will get a BMP and CBC prior to procedure? Thanks

## 2019-10-21 DIAGNOSIS — E7800 Pure hypercholesterolemia, unspecified: Secondary | ICD-10-CM | POA: Diagnosis not present

## 2019-10-21 DIAGNOSIS — E119 Type 2 diabetes mellitus without complications: Secondary | ICD-10-CM | POA: Diagnosis not present

## 2019-10-23 ENCOUNTER — Telehealth (INDEPENDENT_AMBULATORY_CARE_PROVIDER_SITE_OTHER): Payer: Self-pay | Admitting: *Deleted

## 2019-10-23 ENCOUNTER — Encounter (INDEPENDENT_AMBULATORY_CARE_PROVIDER_SITE_OTHER): Payer: Self-pay | Admitting: *Deleted

## 2019-10-23 NOTE — Telephone Encounter (Signed)
Patient needs suprep TCS/EGD sch'd 3/18

## 2019-10-24 MED ORDER — SUPREP BOWEL PREP KIT 17.5-3.13-1.6 GM/177ML PO SOLN: 1.0000 | Freq: Once | ORAL | 0 refills | Status: AC

## 2019-10-26 ENCOUNTER — Other Ambulatory Visit: Payer: Self-pay

## 2019-10-26 ENCOUNTER — Emergency Department (HOSPITAL_COMMUNITY)
Admission: EM | Admit: 2019-10-26 | Discharge: 2019-10-26 | Disposition: A | Discharge status: Home / Self Care | Payer: Medicare HMO | Attending: Emergency Medicine | Admitting: Emergency Medicine

## 2019-10-26 ENCOUNTER — Other Ambulatory Visit (INDEPENDENT_AMBULATORY_CARE_PROVIDER_SITE_OTHER): Payer: Self-pay | Admitting: *Deleted

## 2019-10-26 ENCOUNTER — Encounter (HOSPITAL_COMMUNITY): Payer: Self-pay

## 2019-10-26 ENCOUNTER — Emergency Department (HOSPITAL_COMMUNITY): Payer: Medicare HMO

## 2019-10-26 DIAGNOSIS — R5381 Other malaise: Secondary | ICD-10-CM | POA: Diagnosis not present

## 2019-10-26 DIAGNOSIS — Z20822 Contact with and (suspected) exposure to covid-19: Secondary | ICD-10-CM | POA: Diagnosis not present

## 2019-10-26 DIAGNOSIS — R509 Fever, unspecified: Secondary | ICD-10-CM | POA: Insufficient documentation

## 2019-10-26 DIAGNOSIS — R4182 Altered mental status, unspecified: Secondary | ICD-10-CM | POA: Diagnosis not present

## 2019-10-26 DIAGNOSIS — E119 Type 2 diabetes mellitus without complications: Secondary | ICD-10-CM | POA: Diagnosis not present

## 2019-10-26 DIAGNOSIS — E039 Hypothyroidism, unspecified: Secondary | ICD-10-CM | POA: Insufficient documentation

## 2019-10-26 DIAGNOSIS — Z87891 Personal history of nicotine dependence: Secondary | ICD-10-CM | POA: Diagnosis not present

## 2019-10-26 DIAGNOSIS — Z7984 Long term (current) use of oral hypoglycemic drugs: Secondary | ICD-10-CM | POA: Diagnosis not present

## 2019-10-26 DIAGNOSIS — R0789 Other chest pain: Secondary | ICD-10-CM | POA: Diagnosis not present

## 2019-10-26 LAB — COMPREHENSIVE METABOLIC PANEL
ALT: 31 U/L (ref 0–44)
AST: 47 U/L — ABNORMAL HIGH (ref 15–41)
Albumin: 3.5 g/dL (ref 3.5–5.0)
Alkaline Phosphatase: 121 U/L (ref 38–126)
Anion gap: 11 (ref 5–15)
BUN: 19 mg/dL (ref 8–23)
CO2: 22 mmol/L (ref 22–32)
Calcium: 10.2 mg/dL (ref 8.9–10.3)
Chloride: 108 mmol/L (ref 98–111)
Creatinine, Ser: 1.49 mg/dL — ABNORMAL HIGH (ref 0.61–1.24)
GFR calc Af Amer: 55 mL/min — ABNORMAL LOW (ref >60)
GFR calc non Af Amer: 48 mL/min — ABNORMAL LOW (ref >60)
Glucose, Bld: 154 mg/dL — ABNORMAL HIGH (ref 70–99)
Potassium: 3.3 mmol/L — ABNORMAL LOW (ref 3.5–5.1)
Sodium: 141 mmol/L (ref 135–145)
Total Bilirubin: 4.1 mg/dL — ABNORMAL HIGH (ref 0.3–1.2)
Total Protein: 6.5 g/dL (ref 6.5–8.1)

## 2019-10-26 LAB — URINALYSIS, ROUTINE W REFLEX MICROSCOPIC
Bilirubin Urine: NEGATIVE
Glucose, UA: NEGATIVE mg/dL
Hgb urine dipstick: NEGATIVE
Ketones, ur: NEGATIVE mg/dL
Leukocytes,Ua: NEGATIVE
Nitrite: NEGATIVE
Protein, ur: NEGATIVE mg/dL
Specific Gravity, Urine: 1.011 (ref 1.005–1.030)
pH: 5 (ref 5.0–8.0)

## 2019-10-26 LAB — CBC
HCT: 28.3 % — ABNORMAL LOW (ref 39.0–52.0)
Hemoglobin: 9.6 g/dL — ABNORMAL LOW (ref 13.0–17.0)
MCH: 32.3 pg (ref 26.0–34.0)
MCHC: 33.9 g/dL (ref 30.0–36.0)
MCV: 95.3 fL (ref 80.0–100.0)
Platelets: 45 10*3/uL — ABNORMAL LOW (ref 150–400)
RBC: 2.97 MIL/uL — ABNORMAL LOW (ref 4.22–5.81)
RDW: 15.5 % (ref 11.5–15.5)
WBC: 8.2 10*3/uL (ref 4.0–10.5)
nRBC: 0 % (ref 0.0–0.2)

## 2019-10-26 LAB — AMMONIA: Ammonia: 56 umol/L — ABNORMAL HIGH (ref 9–35)

## 2019-10-26 LAB — ETHANOL: Alcohol, Ethyl (B): <10 mg/dL (ref <10)

## 2019-10-26 LAB — CBG MONITORING, ED: Glucose-Capillary: 154 mg/dL — ABNORMAL HIGH (ref 70–99)

## 2019-10-26 LAB — PROTIME-INR
INR: 1.3 — ABNORMAL HIGH (ref 0.8–1.2)
Prothrombin Time: 15.6 seconds — ABNORMAL HIGH (ref 11.4–15.2)

## 2019-10-26 MED ORDER — SODIUM CHLORIDE 0.9% FLUSH
3.0000 mL | Freq: Once | INTRAVENOUS | Status: AC
  Administered 2019-10-26: 20:00:00 3 mL via INTRAVENOUS

## 2019-10-26 MED ORDER — IBUPROFEN 400 MG PO TABS
600.0000 mg | ORAL_TABLET | Freq: Once | ORAL | Status: AC
  Administered 2019-10-26: 600 mg via ORAL
  Filled 2019-10-26: qty 2

## 2019-10-26 NOTE — ED Provider Notes (Addendum)
[facility] EMERGENCY DEPARTMENT Provider Note   CSN: [id number] Arrival date & time: 10/26/19  1629     History Chief Complaint  Patient presents with  . Altered Mental Status    Jeremy Chavez is a 69 y.o. male.  HPI Patient presents for evaluation of lethargy.  He is on lactulose for NASH cirrhosis, with hepatic encephalopathy.  He currently has a meld score of 14.  He is here with his daughter, who states that since this morning he has been acting abnormal, similar to when he has high ammonia levels.  He is confused and he is also more sleepy than usual.  There are no other known symptoms.  Patient is unable to give history.  Level 5 caveat-altered mental status    Past Medical History:  Diagnosis Date  . Diabetes ([place])   . Hypothyroid   . NAFLD (nonalcoholic fatty liver disease)     Patient Active Problem List   Diagnosis Date Noted  . Thrombocytopenia ([place]) 09/27/2019  . Esophageal varices without bleeding ([place]) 09/27/2019  . Hypercalcemia 06/22/2019  . Hepatic encephalopathy ([place]) 08/22/2018  . Cirrhosis of liver without ascites ([place]) 01/11/2018  . Hypothyroidism   . Diabetes ([place])   . NAFLD (nonalcoholic fatty liver disease)     Past Surgical History:  Procedure Laterality Date  . carpal tunnel rt hand    . ESOPHAGOGASTRODUODENOSCOPY N/A 02/10/2018   Procedure: ESOPHAGOGASTRODUODENOSCOPY (EGD);  Surgeon: Rogene Houston, MD;  Location: [facility] ENDO SUITE;  Service: Endoscopy;  Laterality: N/A;  2:10  . ESOPHAGOGASTRODUODENOSCOPY N/A 05/24/2019   Procedure: ESOPHAGOGASTRODUODENOSCOPY (EGD);  Surgeon: Rogene Houston, MD;  Location: [facility] ENDO SUITE;  Service: Endoscopy;  Laterality: N/A;  622  . umblical hernia repair         Family History  Problem Relation Age of Onset  . Liver cancer Father     Social History   Tobacco Use  . Smoking status: Former Smoker    Packs/day: 1.50    Types: Cigarettes    Quit date: 1999    Years since quitting: 22.1  .  Smokeless tobacco: Never Used  Substance Use Topics  . Alcohol use: No  . Drug use: No    Home Medications Prior to Admission medications   Medication Sig Start Date End Date Taking? Authorizing Provider  Cholecalciferol (VITAMIN D3) 125 MCG (5000 UT) CAPS Take 1 capsule (5,000 Units total) by mouth daily. Patient taking differently: Take 5,000 Units by mouth every evening.  07/06/19  Yes Nida, Marella Chimes, MD  ferrous sulfate 325 (65 FE) MG tablet Take 325 mg by mouth 2 (two) times daily.   Yes [provider]  furosemide (LASIX) 20 MG tablet Take 40 mg by mouth daily after breakfast.    Yes [provider]  lactulose (CHRONULAC) 10 GM/15ML solution Take 30 g by mouth 4 (four) times daily.    Yes [provider]  levothyroxine (SYNTHROID) 125 MCG tablet Take 1 tablet (125 mcg total) by mouth daily before breakfast. 07/06/19  Yes Nida, Marella Chimes, MD  lisinopril-hydrochlorothiazide (PRINZIDE,ZESTORETIC) 20-25 MG tablet Take 1 tablet by mouth every evening.    Yes [provider]  pantoprazole (PROTONIX) 40 MG tablet Take 1 tablet (40 mg total) by mouth 2 (two) times daily before a meal. 08/14/19  Yes Rehman, Mechele Dawley, MD  sucralfate (CARAFATE) 1 g tablet Take 1 tablet (1 g total) by mouth 4 (four) times daily -  with meals and at bedtime. 09/27/19  Yes  Minus Liberty, PA-C    Allergies    Patient has no known allergies.  Review of Systems   Review of Systems  Unable to perform ROS: Mental status change    Physical Exam Updated Vital Signs BP 122/63   Pulse (!) 113   Temp 98.7 F (37.1 C) (Oral)   Resp 17   Ht 6' (1.829 m)   Wt 99.8 kg   SpO2 99%   BMI 29.84 kg/m   Physical Exam Vitals and nursing note reviewed.  Constitutional:      General: He is not in acute distress.    Appearance: He is well-developed. He is ill-appearing. He is not toxic-appearing or diaphoretic.  HENT:     Head: Normocephalic and atraumatic.     Right  Ear: External ear normal.     Left Ear: External ear normal.  Eyes:     Conjunctiva/sclera: Conjunctivae normal.     Pupils: Pupils are equal, round, and reactive to light.  Neck:     Trachea: Phonation normal.  Cardiovascular:     Rate and Rhythm: Normal rate and regular rhythm.     Heart sounds: Normal heart sounds.  Pulmonary:     Effort: Pulmonary effort is normal.     Breath sounds: Normal breath sounds.  Abdominal:     General: There is distension.     Palpations: Abdomen is soft.     Tenderness: There is no abdominal tenderness.  Musculoskeletal:        General: Normal range of motion.     Cervical back: Normal range of motion and neck supple.  Skin:    General: Skin is warm and dry.  Neurological:     Mental Status: He is alert.     Cranial Nerves: No cranial nerve deficit.     Motor: No abnormal muscle tone.     Coordination: Coordination normal.  Psychiatric:        Attention and Perception: He is inattentive.        Mood and Affect: Mood normal.        Speech: He is communicative. Speech is tangential. Speech is not delayed.        Behavior: Behavior is not agitated.        Cognition and Memory: He exhibits impaired recent memory and impaired remote memory.     ED Results / Procedures / Treatments   Labs (all labs ordered are listed, but only abnormal results are displayed) Labs Reviewed  COMPREHENSIVE METABOLIC PANEL - Abnormal; Notable for the following components:      Result Value   Potassium 3.3 (*)    Glucose, Bld 154 (*)    Creatinine, Ser 1.49 (*)    AST 47 (*)    Total Bilirubin 4.1 (*)    GFR calc non Af Amer 48 (*)    GFR calc Af Amer 55 (*)    All other components within normal limits  CBC - Abnormal; Notable for the following components:   RBC 2.97 (*)    Hemoglobin 9.6 (*)    HCT 28.3 (*)    Platelets 45 (*)    All other components within normal limits  AMMONIA - Abnormal; Notable for the following components:   Ammonia 56 (*)    All  other components within normal limits  PROTIME-INR - Abnormal; Notable for the following components:   Prothrombin Time 15.6 (*)    INR 1.3 (*)    All other components within normal limits  CBG MONITORING, ED - Abnormal; Notable for the following components:   Glucose-Capillary 154 (*)    All other components within normal limits  SARS CORONAVIRUS 2 (TAT 6-24 HRS)  URINALYSIS, ROUTINE W REFLEX MICROSCOPIC  ETHANOL    EKG None  Radiology CT Head Wo Contrast  Result Date: 10/26/2019 CLINICAL DATA:  Altered mental status. EXAM: CT HEAD WITHOUT CONTRAST TECHNIQUE: Contiguous axial images were obtained from the base of the skull through the vertex without intravenous contrast. COMPARISON:  April 10, 2019 FINDINGS: Brain: There is mild cerebral atrophy with widening of the extra-axial spaces and ventricular dilatation. There are areas of decreased attenuation within the white matter tracts of the supratentorial brain, consistent with microvascular disease changes. Vascular: No hyperdense vessel or unexpected calcification. Skull: Normal. Negative for fracture or focal lesion. Sinuses/Orbits: No acute finding. Other: None. IMPRESSION: No acute intracranial pathology. Electronically Signed   By: Virgina Norfolk M.D.   On: 10/26/2019 20:35    Procedures Procedures (including critical care time)  Medications Ordered in ED Medications  sodium chloride flush (NS) 0.9 % injection 3 mL (3 mLs Intravenous Given 10/26/19 1930)  ibuprofen (ADVIL) tablet 600 mg (600 mg Oral Given 10/26/19 1959)    ED Course  I have reviewed the triage vital signs and the nursing notes.  Pertinent labs & imaging results that were available during my care of the patient were reviewed by me and considered in my medical decision making (see chart for details).  Clinical Course as of Oct 26 2026  Fri Oct 26, 2019  1939 Normal except potassium low, glucose high, creatinine high, AST high, total bilirubin high, GFR low   Comprehensive metabolic panel(!) [EW]  7782 Abnormal, elevated  Ammonia(!) [EW]  1940 Normal except hemoglobin low  CBC(!) [EW]  1940 Elevated  CBG monitoring, ED(!) [EW]  1941 Ammonia(!): 56 [EW]  1942 Normal  Urinalysis, Routine w reflex microscopic [EW]  2138 Normal  Ethanol [EW]  2138 Mild elevation  Protime-INR(!) [EW]  2138 Per radiology, no acute abnormalities  CT Head Wo Contrast [EW]  Sat Oct 27, 2019  2028 , platelets low   [EW]    Clinical Course User Index [EW] Daleen Bo, MD   MDM Rules/Calculators/A&P                       Patient Vitals for the past 24 hrs:  BP Temp Temp src Pulse Resp SpO2  10/26/19 2230 122/63 - - (!) 113 17 99 %  10/26/19 2229 - 98.7 F (37.1 C) Oral (!) 107 20 99 %  10/26/19 2215 - - - - (!) 22 -  10/26/19 2200 120/61 - - (!) 110 19 (!) 87 %  10/26/19 2146 - 99.1 F (37.3 C) Oral - - -  10/26/19 2145 - - - (!) 108 16 97 %  10/26/19 2130 (!) 120/59 - - (!) 112 18 98 %  10/26/19 2115 - - - (!) 110 17 98 %  10/26/19 2100 124/62 - - (!) 109 17 94 %    10:28 PM Reevaluation with update and discussion. After initial assessment and treatment, an updated evaluation reveals patient seems less confused and brighter at this time.  Findings discussed with patient and his daughter who had been communicating with the patient's wife on the phone.  They are all comfortable with discharge and observation in the home setting.  Findings discussed and questions answered. Daleen Bo   Medical Decision Making: Nonspecific confusion and  lethargy, with low-grade fever.  Possible COVID-19 infection however doubt significant/serious bacterial infection.  Doubt meningitis.  Doubt hepatic encephalopathy.  Doubt acute intoxication.  Doubt impending vascular collapse.  Stable for discharge with observation at home.  Daelen Belvedere was evaluated in Emergency Department on 10/27/2019 for the symptoms described in the history of present illness. He was  evaluated in the context of the global COVID-19 pandemic, which necessitated consideration that the patient might be at risk for infection with the SARS-CoV-2 virus that causes COVID-19. Institutional protocols and algorithms that pertain to the evaluation of patients at risk for COVID-19 are in a state of rapid change based on information released by regulatory bodies including the CDC and federal and state organizations. These policies and algorithms were followed during the patient's care in the ED.   CRITICAL CARE-no Performed by: Daleen Bo  Nursing Notes Reviewed/ Care Coordinated Applicable Imaging Reviewed Interpretation of Laboratory Data incorporated into ED treatment  The patient appears reasonably screened and/or stabilized for discharge and I doubt any other medical condition or other [facility] requiring further screening, evaluation, or treatment in the ED at this time prior to discharge.  Plan: Home Medications-continue usual; Home Treatments-rest, fluids; return here if the recommended treatment, does not improve the symptoms; Recommended follow up-PCP or return here as needed.   Final Clinical Impression(s) / ED Diagnoses Final diagnoses:  Malaise  Fever, unspecified fever cause    Rx / DC Orders ED Discharge Orders    None       Daleen Bo, MD 10/26/19 2230    Daleen Bo, MD 10/27/19 2029

## 2019-10-26 NOTE — ED Triage Notes (Signed)
Pt brought to ED for altered mental status. Per daughter, he has been confused most of the day, pt with hx of cirrhosis, lethargic most of the day. Pt oriented x 3.

## 2019-10-26 NOTE — Discharge Instructions (Addendum)
The testing did not show any serious problems.  The Covid test will return within 12 to 18 hours.  Make sure that he is eating and drinking regularly.  You can use ibuprofen 600 mg every 6-8 hours as needed for fever.  Return here if his condition worsens.

## 2019-10-27 ENCOUNTER — Other Ambulatory Visit: Payer: Self-pay

## 2019-10-27 LAB — SARS CORONAVIRUS 2 (TAT 6-24 HRS): SARS Coronavirus 2: NEGATIVE

## 2019-11-15 ENCOUNTER — Encounter (INDEPENDENT_AMBULATORY_CARE_PROVIDER_SITE_OTHER): Payer: Self-pay | Admitting: *Deleted

## 2019-11-28 DIAGNOSIS — I10 Essential (primary) hypertension: Secondary | ICD-10-CM | POA: Diagnosis not present

## 2019-11-28 DIAGNOSIS — Z299 Encounter for prophylactic measures, unspecified: Secondary | ICD-10-CM | POA: Diagnosis not present

## 2019-11-28 DIAGNOSIS — J069 Acute upper respiratory infection, unspecified: Secondary | ICD-10-CM | POA: Diagnosis not present

## 2019-11-28 DIAGNOSIS — R05 Cough: Secondary | ICD-10-CM | POA: Diagnosis not present

## 2019-11-28 DIAGNOSIS — Z6830 Body mass index (BMI) 30.0-30.9, adult: Secondary | ICD-10-CM | POA: Diagnosis not present

## 2019-12-04 ENCOUNTER — Other Ambulatory Visit: Payer: Self-pay

## 2019-12-04 ENCOUNTER — Other Ambulatory Visit (HOSPITAL_COMMUNITY)
Admission: RE | Admit: 2019-12-04 | Discharge: 2019-12-04 | Disposition: A | Discharge status: Home / Self Care | Payer: Medicare HMO | Source: Ambulatory Visit | Attending: Internal Medicine | Admitting: Internal Medicine

## 2019-12-04 DIAGNOSIS — Z20822 Contact with and (suspected) exposure to covid-19: Secondary | ICD-10-CM | POA: Diagnosis not present

## 2019-12-04 DIAGNOSIS — K7460 Unspecified cirrhosis of liver: Secondary | ICD-10-CM | POA: Diagnosis not present

## 2019-12-04 DIAGNOSIS — Z01812 Encounter for preprocedural laboratory examination: Secondary | ICD-10-CM | POA: Diagnosis not present

## 2019-12-04 LAB — SARS CORONAVIRUS 2 (TAT 6-24 HRS): SARS Coronavirus 2: NEGATIVE

## 2019-12-05 DIAGNOSIS — K828 Other specified diseases of gallbladder: Secondary | ICD-10-CM | POA: Diagnosis not present

## 2019-12-05 DIAGNOSIS — I8510 Secondary esophageal varices without bleeding: Secondary | ICD-10-CM | POA: Diagnosis not present

## 2019-12-05 DIAGNOSIS — K7460 Unspecified cirrhosis of liver: Secondary | ICD-10-CM | POA: Diagnosis not present

## 2019-12-05 DIAGNOSIS — R188 Other ascites: Secondary | ICD-10-CM | POA: Diagnosis not present

## 2019-12-05 DIAGNOSIS — K766 Portal hypertension: Secondary | ICD-10-CM | POA: Diagnosis not present

## 2019-12-05 DIAGNOSIS — R161 Splenomegaly, not elsewhere classified: Secondary | ICD-10-CM | POA: Diagnosis not present

## 2019-12-05 DIAGNOSIS — Z79899 Other long term (current) drug therapy: Secondary | ICD-10-CM | POA: Diagnosis not present

## 2019-12-05 DIAGNOSIS — K7290 Hepatic failure, unspecified without coma: Secondary | ICD-10-CM | POA: Diagnosis not present

## 2019-12-05 DIAGNOSIS — K7581 Nonalcoholic steatohepatitis (NASH): Secondary | ICD-10-CM | POA: Diagnosis not present

## 2019-12-05 LAB — CBC WITH DIFFERENTIAL/PLATELET
Absolute Monocytes: 262 cells/uL (ref 200–950)
Basophils Absolute: 10 cells/uL (ref 0–200)
Basophils Relative: 0.3 %
Eosinophils Absolute: 71 cells/uL (ref 15–500)
Eosinophils Relative: 2.1 %
HCT: 25.3 % — ABNORMAL LOW (ref 38.5–50.0)
Hemoglobin: 8.7 g/dL — ABNORMAL LOW (ref 13.2–17.1)
Lymphs Abs: 666 cells/uL — ABNORMAL LOW (ref 850–3900)
MCH: 32.7 pg (ref 27.0–33.0)
MCHC: 34.4 g/dL (ref 32.0–36.0)
MCV: 95.1 fL (ref 80.0–100.0)
MPV: 11.5 fL (ref 7.5–12.5)
Monocytes Relative: 7.7 %
Neutro Abs: 2390 cells/uL (ref 1500–7800)
Neutrophils Relative %: 70.3 %
Platelets: 38 10*3/uL — ABNORMAL LOW (ref 140–400)
RBC: 2.66 10*6/uL — ABNORMAL LOW (ref 4.20–5.80)
RDW: 13.4 % (ref 11.0–15.0)
Total Lymphocyte: 19.6 %
WBC: 3.4 10*3/uL — ABNORMAL LOW (ref 3.8–10.8)

## 2019-12-05 LAB — COMPLETE METABOLIC PANEL WITH GFR
AG Ratio: 1.2 (calc) (ref 1.0–2.5)
ALT: 21 U/L (ref 9–46)
AST: 45 U/L — ABNORMAL HIGH (ref 10–35)
Albumin: 3 g/dL — ABNORMAL LOW (ref 3.6–5.1)
Alkaline phosphatase (APISO): 127 U/L (ref 35–144)
BUN/Creatinine Ratio: 10 (calc) (ref 6–22)
BUN: 16 mg/dL (ref 7–25)
CO2: 23 mmol/L (ref 20–32)
Calcium: 10.5 mg/dL — ABNORMAL HIGH (ref 8.6–10.3)
Chloride: 109 mmol/L (ref 98–110)
Creat: 1.55 mg/dL — ABNORMAL HIGH (ref 0.70–1.25)
GFR, Est African American: 53 mL/min/{1.73_m2} — ABNORMAL LOW (ref >=60)
GFR, Est Non African American: 46 mL/min/{1.73_m2} — ABNORMAL LOW (ref >=60)
Globulin: 2.6 g/dL (calc) (ref 1.9–3.7)
Glucose, Bld: 107 mg/dL — ABNORMAL HIGH (ref 65–99)
Potassium: 4.8 mmol/L (ref 3.5–5.3)
Sodium: 138 mmol/L (ref 135–146)
Total Bilirubin: 1.7 mg/dL — ABNORMAL HIGH (ref 0.2–1.2)
Total Protein: 5.6 g/dL — ABNORMAL LOW (ref 6.1–8.1)

## 2019-12-06 ENCOUNTER — Ambulatory Visit (HOSPITAL_COMMUNITY)
Admission: RE | Admit: 2019-12-06 | Discharge: 2019-12-06 | Disposition: A | Discharge status: Home / Self Care | Payer: Medicare HMO | Attending: Internal Medicine | Admitting: Internal Medicine

## 2019-12-06 ENCOUNTER — Encounter (HOSPITAL_COMMUNITY)
Admission: RE | Disposition: A | Discharge status: Home / Self Care | Payer: Self-pay | Source: Home / Self Care | Attending: Internal Medicine

## 2019-12-06 ENCOUNTER — Other Ambulatory Visit: Payer: Self-pay

## 2019-12-06 ENCOUNTER — Encounter (HOSPITAL_COMMUNITY): Payer: Self-pay | Admitting: Internal Medicine

## 2019-12-06 DIAGNOSIS — K3189 Other diseases of stomach and duodenum: Secondary | ICD-10-CM | POA: Diagnosis not present

## 2019-12-06 DIAGNOSIS — I8510 Secondary esophageal varices without bleeding: Secondary | ICD-10-CM | POA: Diagnosis not present

## 2019-12-06 DIAGNOSIS — D696 Thrombocytopenia, unspecified: Secondary | ICD-10-CM | POA: Insufficient documentation

## 2019-12-06 DIAGNOSIS — E039 Hypothyroidism, unspecified: Secondary | ICD-10-CM | POA: Insufficient documentation

## 2019-12-06 DIAGNOSIS — Z8719 Personal history of other diseases of the digestive system: Secondary | ICD-10-CM | POA: Diagnosis not present

## 2019-12-06 DIAGNOSIS — K648 Other hemorrhoids: Secondary | ICD-10-CM | POA: Insufficient documentation

## 2019-12-06 DIAGNOSIS — Z1211 Encounter for screening for malignant neoplasm of colon: Secondary | ICD-10-CM | POA: Diagnosis not present

## 2019-12-06 DIAGNOSIS — Z79899 Other long term (current) drug therapy: Secondary | ICD-10-CM | POA: Insufficient documentation

## 2019-12-06 DIAGNOSIS — Z09 Encounter for follow-up examination after completed treatment for conditions other than malignant neoplasm: Secondary | ICD-10-CM | POA: Diagnosis not present

## 2019-12-06 DIAGNOSIS — K644 Residual hemorrhoidal skin tags: Secondary | ICD-10-CM | POA: Insufficient documentation

## 2019-12-06 DIAGNOSIS — K5520 Angiodysplasia of colon without hemorrhage: Secondary | ICD-10-CM | POA: Diagnosis not present

## 2019-12-06 DIAGNOSIS — Z86010 Personal history of colonic polyps: Secondary | ICD-10-CM | POA: Diagnosis not present

## 2019-12-06 DIAGNOSIS — K5730 Diverticulosis of large intestine without perforation or abscess without bleeding: Secondary | ICD-10-CM | POA: Insufficient documentation

## 2019-12-06 DIAGNOSIS — Z79890 Hormone replacement therapy (postmenopausal): Secondary | ICD-10-CM | POA: Insufficient documentation

## 2019-12-06 DIAGNOSIS — Z87891 Personal history of nicotine dependence: Secondary | ICD-10-CM | POA: Insufficient documentation

## 2019-12-06 DIAGNOSIS — K766 Portal hypertension: Secondary | ICD-10-CM | POA: Insufficient documentation

## 2019-12-06 DIAGNOSIS — Z13810 Encounter for screening for upper gastrointestinal disorder: Secondary | ICD-10-CM | POA: Diagnosis not present

## 2019-12-06 DIAGNOSIS — R600 Localized edema: Secondary | ICD-10-CM | POA: Diagnosis not present

## 2019-12-06 DIAGNOSIS — K7460 Unspecified cirrhosis of liver: Secondary | ICD-10-CM | POA: Insufficient documentation

## 2019-12-06 DIAGNOSIS — E119 Type 2 diabetes mellitus without complications: Secondary | ICD-10-CM | POA: Insufficient documentation

## 2019-12-06 DIAGNOSIS — I8500 Esophageal varices without bleeding: Secondary | ICD-10-CM

## 2019-12-06 HISTORY — PX: COLONOSCOPY: SHX5424

## 2019-12-06 HISTORY — PX: ESOPHAGOGASTRODUODENOSCOPY: SHX5428

## 2019-12-06 SURGERY — EGD (ESOPHAGOGASTRODUODENOSCOPY)
Anesthesia: Moderate Sedation

## 2019-12-06 MED ORDER — LIDOCAINE VISCOUS HCL 2 % MT SOLN
OROMUCOSAL | Status: DC | PRN
  Administered 2019-12-06: 1 via OROMUCOSAL

## 2019-12-06 MED ORDER — LIDOCAINE VISCOUS HCL 2 % MT SOLN
OROMUCOSAL | Status: AC
  Filled 2019-12-06: qty 15

## 2019-12-06 MED ORDER — MEPERIDINE HCL 50 MG/ML IJ SOLN
INTRAMUSCULAR | Status: DC | PRN
  Administered 2019-12-06: 20 mg

## 2019-12-06 MED ORDER — STERILE WATER FOR IRRIGATION IR SOLN: Status: DC | PRN

## 2019-12-06 MED ORDER — PANTOPRAZOLE SODIUM 40 MG PO TBEC: 40.0000 mg | DELAYED_RELEASE_TABLET | Freq: Every day | ORAL | 1 refills | Status: DC

## 2019-12-06 MED ORDER — MIDAZOLAM HCL 5 MG/5ML IJ SOLN
INTRAMUSCULAR | Status: AC
  Filled 2019-12-06: qty 10

## 2019-12-06 MED ORDER — MIDAZOLAM HCL 5 MG/5ML IJ SOLN
INTRAMUSCULAR | Status: DC | PRN
  Administered 2019-12-06: 2 mg via INTRAVENOUS
  Administered 2019-12-06: 1 mg via INTRAVENOUS
  Administered 2019-12-06: 2 mg via INTRAVENOUS

## 2019-12-06 MED ORDER — MEPERIDINE HCL 50 MG/ML IJ SOLN
INTRAMUSCULAR | Status: AC
  Filled 2019-12-06: qty 1

## 2019-12-06 MED ORDER — SODIUM CHLORIDE 0.9 % IV SOLN: INTRAVENOUS | Status: DC

## 2019-12-06 MED ORDER — LISINOPRIL 20 MG PO TABS: 20.0000 mg | ORAL_TABLET | Freq: Every day | ORAL | 11 refills | Status: DC

## 2019-12-06 NOTE — Op Note (Signed)
[facility] Patient Name: Jeremy Chavez Procedure Date: 12/06/2019 7:51 AM MRN: [id number] Date of Birth: 1951-09-11 Attending MD: Hildred Laser , MD CSN: [id number] Age: 69 Admit Type: Outpatient Procedure:                Colonoscopy Indications:              High risk colon cancer surveillance: Personal                            history of colonic polyps Providers:                Hildred Laser, MD, Janeece Riggers, RN, Aram Candela Referring MD:             Glenda Chroman, MD Medicines:                None Complications:            No immediate complications. Estimated Blood Loss:     Estimated blood loss: none. Procedure:                Pre-Anesthesia Assessment:                           - Prior to the procedure, a History and Physical                            was performed, and patient medications and                            allergies were reviewed. The patient's tolerance of                            previous anesthesia was also reviewed. The risks                            and benefits of the procedure and the sedation                            options and risks were discussed with the patient.                            All questions were answered, and informed consent                            was obtained. Prior Anticoagulants: The patient has                            taken no previous anticoagulant or antiplatelet                            agents except for aspirin. ASA Grade Assessment:                            III - A patient with severe systemic disease. After  reviewing the risks and benefits, the patient was                            deemed in satisfactory condition to undergo the                            procedure.                           - Prior to the procedure, a History and Physical                            was performed, and patient medications and                            allergies were reviewed. The patient's  tolerance of                            previous anesthesia was also reviewed. The risks                            and benefits of the procedure and the sedation                            options and risks were discussed with the patient.                            All questions were answered, and informed consent                            was obtained. Prior Anticoagulants: The patient has                            taken no previous anticoagulant or antiplatelet                            agents. ASA Grade Assessment: III - A patient with                            severe systemic disease. After reviewing the risks                            and benefits, the patient was deemed in                            satisfactory condition to undergo the procedure.                           After obtaining informed consent, the colonoscope                            was passed under direct vision. Throughout the  procedure, the patient's blood pressure, pulse, and                            oxygen saturations were monitored continuously. The                            [id number] ([id number]) was introduced through the                            anus and advanced to the the cecum, identified by                            appendiceal orifice and ileocecal valve. The                            colonoscopy was performed without difficulty. The                            patient tolerated the procedure well. The quality                            of the bowel preparation was excellent. Scope In: 7:54:42 AM Scope Out: 8:15:08 AM Scope Withdrawal Time: 0 hours 4 minutes 25 seconds  Total Procedure Duration: 0 hours 20 minutes 26 seconds  Findings:      Skin tags were found on perianal exam.      A single small angioectasia without bleeding was found at the hepatic       flexure.      Scattered diverticula were found in the sigmoid colon.      Internal hemorrhoids were found  during retroflexion. The hemorrhoids       were medium-sized. Impression:               - Perianal skin tags found on perianal exam.                           - A single non-bleeding colonic angioectasia.                           - Diverticulosis in the sigmoid colon.                           - Internal hemorrhoids.                           - No specimens collected. Moderate Sedation:      Moderate (conscious) sedation was administered by the endoscopy nurse       and supervised by the endoscopist. The following parameters were       monitored: oxygen saturation, heart rate, blood pressure, CO2       capnography and response to care. Total physician intraservice time was       14 minutes. Recommendation:           - Patient has a contact number available for  emergencies. The signs and symptoms of potential                            delayed complications were discussed with the                            patient. Return to normal activities tomorrow.                            Written discharge instructions were provided to the                            patient.                           - Low sodium diet today.                           - See discharge instructions for change in meds.                           - No aspirin, ibuprofen, naproxen, or other                            non-steroidal anti-inflammatory drugs.                           - Repeat colonoscopy in 5 years for surveillance. Procedure Code(s):        --- Professional ---                           [phone], Colonoscopy, flexible; diagnostic, including                            collection of specimen(s) by brushing or washing,                            when performed (separate procedure)                           G0500, Moderate sedation services provided by the                            same physician or other qualified health care                            professional performing a  gastrointestinal                            endoscopic service that sedation supports,                            requiring the presence of an independent trained                            observer to assist  in the monitoring of the                            patient's level of consciousness and physiological                            status; initial 15 minutes of intra-service time;                            patient age 13 years or older (additional time may                            be reported with [phone], as appropriate) Diagnosis Code(s):        --- Professional ---                           Z86.010, Personal history of colonic polyps                           K55.20, Angiodysplasia of colon without hemorrhage                           K64.8, Other hemorrhoids                           K64.4, Residual hemorrhoidal skin tags                           K57.30, Diverticulosis of large intestine without                            perforation or abscess without bleeding CPT copyright 2019 [employer]. All rights reserved. The codes documented in this report are preliminary and upon coder review may  be revised to meet current compliance requirements. Hildred Laser, MD Hildred Laser, MD 12/06/2019 8:35:46 AM This report has been signed electronically. Number of Addenda: 0

## 2019-12-06 NOTE — Op Note (Addendum)
[facility] Patient Name: Jeremy Chavez Procedure Date: 12/06/2019 7:04 AM MRN: [id number] Date of Birth: 1951-05-30 Attending MD: Hildred Laser , MD CSN: [id number] Age: 69 Admit Type: Outpatient Procedure:                Upper GI endoscopy Indications:              Cirrhosis with suspected esophageal varices Providers:                Hildred Laser, MD, Janeece Riggers, RN, Aram Candela Referring MD:             Glenda Chroman, MD Medicines:                Lidocaine spray, Midazolam 5 mg IV, Meperidine 20                            mg IV Complications:            No immediate complications. Estimated Blood Loss:     Estimated blood loss: none. Procedure:                Pre-Anesthesia Assessment:                           - Prior to the procedure, a History and Physical                            was performed, and patient medications and                            allergies were reviewed. The patient's tolerance of                            previous anesthesia was also reviewed. The risks                            and benefits of the procedure and the sedation                            options and risks were discussed with the patient.                            All questions were answered, and informed consent                            was obtained. Prior Anticoagulants: The patient has                            taken no previous anticoagulant or antiplatelet                            agents except for aspirin. ASA Grade Assessment:                            III - A patient with severe systemic disease. After  reviewing the risks and benefits, the patient was                            deemed in satisfactory condition to undergo the                            procedure.                           After obtaining informed consent, the endoscope was                            passed under direct vision. Throughout the   procedure, the patient's blood pressure, pulse, and                            oxygen saturations were monitored continuously. The                            GIF-H190 ([id number]) scope was introduced through the                            mouth, and advanced to the second part of duodenum.                            The upper GI endoscopy was accomplished without                            difficulty. The patient tolerated the procedure                            well. Scope In: 7:47:32 AM Scope Out: 7:51:16 AM Total Procedure Duration: 0 hours 3 minutes 44 seconds  Findings:      The hypopharynx was normal.      The proximal esophagus and mid esophagus were normal.      Grade I varices were found in the distal esophagus.      The Z-line was regular and was found 45 cm from the incisors.      Moderate portal hypertensive gastropathy was found in the entire       examined stomach.      The duodenal bulb and second portion of the duodenum were normal. Impression:               - Normal hypopharynx.                           - Normal proximal esophagus and mid esophagus.                           - Grade I esophageal varices. Too small too be                            banded.                           - Z-line regular, 45 cm from the  incisors.                           - Portal hypertensive gastropathy.                           - Normal duodenal bulb and second portion of the                            duodenum.                           - No specimens collected. Moderate Sedation:      Moderate (conscious) sedation was administered by the endoscopy nurse       and supervised by the endoscopist. The following parameters were       monitored: oxygen saturation, heart rate, blood pressure, CO2       capnography and response to care. Total physician intraservice time was       11 minutes. Recommendation:           - Patient has a contact number available for                             emergencies. The signs and symptoms of potential                            delayed complications were discussed with the                            patient. Return to normal activities tomorrow.                            Written discharge instructions were provided to the                            patient.                           - Low sodium diet today.                           - Continue present medications except stop                            Sucralfate and HCTZ and decrease Pantoprazole to                            once daily.                           - Repeat upper endoscopy in 6 months.                           - Return to GI clinic in 1 month.                           - See the other procedure note  for documentation of                            additional recommendations. Procedure Code(s):        --- Professional ---                           [phone], Esophagogastroduodenoscopy, flexible,                            transoral; diagnostic, including collection of                            specimen(s) by brushing or washing, when performed                            (separate procedure)                           G0500, Moderate sedation services provided by the                            same physician or other qualified health care                            professional performing a gastrointestinal                            endoscopic service that sedation supports,                            requiring the presence of an independent trained                            observer to assist in the monitoring of the                            patient's level of consciousness and physiological                            status; initial 15 minutes of intra-service time;                            patient age 38 years or older (additional time may                            be reported with [phone], as appropriate) Diagnosis Code(s):        --- Professional ---                            K74.60, Unspecified cirrhosis of liver                           I85.10, Secondary esophageal varices without  bleeding                           K76.6, Portal hypertension                           K31.89, Other diseases of stomach and duodenum CPT copyright 2019 American Medical Association. All rights reserved. The codes documented in this report are preliminary and upon coder review may  be revised to meet current compliance requirements. Hildred Laser, MD Hildred Laser, MD 12/06/2019 8:31:41 AM This report has been signed electronically. Number of Addenda: 0

## 2019-12-06 NOTE — Discharge Instructions (Signed)
Discontinue sucralfate and HCTZ. Reduce pantoprazole to 40 mg daily.  Take it 30 minutes before breakfast. Resume other medications as before. Low-salt diet. No driving for 24 hours. Will schedule echocardiogram. Blood work in 2 weeks.  Office will call.     Upper Endoscopy, Adult, Care After This sheet gives you information about how to care for yourself after your procedure. Your health care provider may also give you more specific instructions. If you have problems or questions, contact your health care provider. What can I expect after the procedure? After the procedure, it is common to have:  A sore throat.  Mild stomach pain or discomfort.  Bloating.  Nausea. Follow these instructions at home:   Follow instructions from your health care provider about what to eat or drink after your procedure.  Return to your normal activities as told by your health care provider. Ask your health care provider what activities are safe for you.  Take over-the-counter and prescription medicines only as told by your health care provider.  Do not drive for 24 hours if you were given a sedative during your procedure.  Keep all follow-up visits as told by your health care provider. This is important. Contact a health care provider if you have:  A sore throat that lasts longer than one day.  Trouble swallowing. Get help right away if:  You vomit blood or your vomit looks like coffee grounds.  You have: ? A fever. ? Bloody, black, or tarry stools. ? A severe sore throat or you cannot swallow. ? Difficulty breathing. ? Severe pain in your chest or abdomen. Summary  After the procedure, it is common to have a sore throat, mild stomach discomfort, bloating, and nausea.  Do not drive for 24 hours if you were given a sedative during the procedure.  Follow instructions from your health care provider about what to eat or drink after your procedure.  Return to your normal activities as  told by your health care provider. This information is not intended to replace advice given to you by your health care provider. Make sure you discuss any questions you have with your health care provider. Document Revised: 02/28/2018 Document Reviewed: 02/06/2018 Elsevier Patient Education  [place].    Colonoscopy, Adult, Care After This sheet gives you information about how to care for yourself after your procedure. Your doctor may also give you more specific instructions. If you have problems or questions, call your doctor. What can I expect after the procedure? After the procedure, it is common to have:  A small amount of blood in your poop (stool) for 24 hours.  Some gas.  Mild cramping or bloating in your belly (abdomen). Follow these instructions at home: Eating and drinking   Drink enough fluid to keep your pee (urine) pale yellow.  Follow instructions from your doctor about what you cannot eat or drink.  Return to your normal diet as told by your doctor. Avoid heavy or fried foods that are hard to digest. Activity  Rest as told by your doctor.  Do not sit for a long time without moving. Get up to take short walks every 1-2 hours. This is important. Ask for help if you feel weak or unsteady.  Return to your normal activities as told by your doctor. Ask your doctor what activities are safe for you. To help cramping and bloating:   Try walking around.  Put heat on your belly as told by your doctor. Use the heat  source that your doctor recommends, such as a moist heat pack or a heating pad. ? Put a towel between your skin and the heat source. ? Leave the heat on for 20-30 minutes. ? Remove the heat if your skin turns bright red. This is very important if you are unable to feel pain, heat, or cold. You may have a greater risk of getting burned. General instructions  For the first 24 hours after the procedure: ? Do not drive or use machinery. ? Do not sign  important documents. ? Do not drink alcohol. ? Do your daily activities more slowly than normal. ? Eat foods that are soft and easy to digest.  Take over-the-counter or prescription medicines only as told by your doctor.  Keep all follow-up visits as told by your doctor. This is important. Contact a doctor if:  You have blood in your poop 2-3 days after the procedure. Get help right away if:  You have more than a small amount of blood in your poop.  You see large clumps of tissue (blood clots) in your poop.  Your belly is swollen.  You feel like you may vomit (nauseous).  You vomit.  You have a fever.  You have belly pain that gets worse, and medicine does not help your pain. Summary  After the procedure, it is common to have a small amount of blood in your poop. You may also have mild cramping and bloating in your belly.  For the first 24 hours after the procedure, do not drive or use machinery, do not sign important documents, and do not drink alcohol.  Get help right away if you have a lot of blood in your poop, feel like you may vomit, have a fever, or have more belly pain. This information is not intended to replace advice given to you by your health care provider. Make sure you discuss any questions you have with your health care provider. Document Revised: 04/02/2019 Document Reviewed: 04/02/2019 Elsevier Patient Education  [place].     Low-Sodium Eating Plan Sodium, which is an element that makes up salt, helps you maintain a healthy balance of fluids in your body. Too much sodium can increase your blood pressure and cause fluid and waste to be held in your body. Your health care provider or dietitian may recommend following this plan if you have high blood pressure (hypertension), kidney disease, liver disease, or heart failure. Eating less sodium can help lower your blood pressure, reduce swelling, and protect your heart, liver, and kidneys. What are  tips for following this plan? General guidelines  Most people on this plan should limit their sodium intake to 1,500-2,000 mg (milligrams) of sodium each day. Reading food labels   The Nutrition Facts label lists the amount of sodium in one serving of the food. If you eat more than one serving, you must multiply the listed amount of sodium by the number of servings.  Choose foods with less than 140 mg of sodium per serving.  Avoid foods with 300 mg of sodium or more per serving. Shopping  Look for lower-sodium products, often labeled as "low-sodium" or "no salt added."  Always check the sodium content even if foods are labeled as "unsalted" or "no salt added".  Buy fresh foods. ? Avoid canned foods and premade or frozen meals. ? Avoid canned, cured, or processed meats  Buy breads that have less than 80 mg of sodium per slice. Cooking  Eat more home-cooked food and  less restaurant, buffet, and fast food.  Avoid adding salt when cooking. Use salt-free seasonings or herbs instead of table salt or sea salt. Check with your health care provider or pharmacist before using salt substitutes.  Cook with plant-based oils, such as canola, sunflower, or olive oil. Meal planning  When eating at a restaurant, ask that your food be prepared with less salt or no salt, if possible.  Avoid foods that contain MSG (monosodium glutamate). MSG is sometimes added to [place] food, bouillon, and some canned foods. What foods are recommended? The items listed may not be a complete list. Talk with your dietitian about what dietary choices are best for you. Grains Low-sodium cereals, including oats, puffed wheat and rice, and shredded wheat. Low-sodium crackers. Unsalted rice. Unsalted pasta. Low-sodium bread. Whole-grain breads and whole-grain pasta. Vegetables Fresh or frozen vegetables. "No salt added" canned vegetables. "No salt added" tomato sauce and paste. Low-sodium or reduced-sodium tomato and  vegetable juice. Fruits Fresh, frozen, or canned fruit. Fruit juice. Meats and other protein foods Fresh or frozen (no salt added) meat, poultry, seafood, and fish. Low-sodium canned tuna and salmon. Unsalted nuts. Dried peas, beans, and lentils without added salt. Unsalted canned beans. Eggs. Unsalted nut butters. Dairy Milk. Soy milk. Cheese that is naturally low in sodium, such as ricotta cheese, fresh mozzarella, or Swiss cheese Low-sodium or reduced-sodium cheese. Cream cheese. Yogurt. Fats and oils Unsalted butter. Unsalted margarine with no trans fat. Vegetable oils such as canola or olive oils. Seasonings and other foods Fresh and dried herbs and spices. Salt-free seasonings. Low-sodium mustard and ketchup. Sodium-free salad dressing. Sodium-free light mayonnaise. Fresh or refrigerated horseradish. Lemon juice. Vinegar. Homemade, reduced-sodium, or low-sodium soups. Unsalted popcorn and pretzels. Low-salt or salt-free chips. What foods are not recommended? The items listed may not be a complete list. Talk with your dietitian about what dietary choices are best for you. Grains Instant hot cereals. Bread stuffing, pancake, and biscuit mixes. Croutons. Seasoned rice or pasta mixes. Noodle soup cups. Boxed or frozen macaroni and cheese. Regular salted crackers. Self-rising flour. Vegetables Sauerkraut, pickled vegetables, and relishes. Olives. [place] fries. Onion rings. Regular canned vegetables (not low-sodium or reduced-sodium). Regular canned tomato sauce and paste (not low-sodium or reduced-sodium). Regular tomato and vegetable juice (not low-sodium or reduced-sodium). Frozen vegetables in sauces. Meats and other protein foods Meat or fish that is salted, canned, smoked, spiced, or pickled. Bacon, ham, sausage, hotdogs, corned beef, chipped beef, packaged lunch meats, salt pork, jerky, pickled herring, anchovies, regular canned tuna, sardines, salted nuts. Dairy Processed cheese and  cheese spreads. Cheese curds. Blue cheese. Feta cheese. String cheese. Regular cottage cheese. Buttermilk. Canned milk. Fats and oils Salted butter. Regular margarine. Ghee. Bacon fat. Seasonings and other foods Onion salt, garlic salt, seasoned salt, table salt, and sea salt. Canned and packaged gravies. Worcestershire sauce. Tartar sauce. Barbecue sauce. Teriyaki sauce. Soy sauce, including reduced-sodium. Steak sauce. Fish sauce. Oyster sauce. Cocktail sauce. Horseradish that you find on the shelf. Regular ketchup and mustard. Meat flavorings and tenderizers. Bouillon cubes. Hot sauce and Tabasco sauce. Premade or packaged marinades. Premade or packaged taco seasonings. Relishes. Regular salad dressings. Salsa. Potato and tortilla chips. Corn chips and puffs. Salted popcorn and pretzels. Canned or dried soups. Pizza. Frozen entrees and pot pies. Summary  Eating less sodium can help lower your blood pressure, reduce swelling, and protect your heart, liver, and kidneys.  Most people on this plan should limit their sodium intake to 1,500-2,000 mg (milligrams) of sodium  each day.  Canned, boxed, and frozen foods are high in sodium. Restaurant foods, fast foods, and pizza are also very high in sodium. You also get sodium by adding salt to food.  Try to cook at home, eat more fresh fruits and vegetables, and eat less fast food, canned, processed, or prepared foods. This information is not intended to replace advice given to you by your health care provider. Make sure you discuss any questions you have with your health care provider. Document Revised: 08/19/2017 Document Reviewed: 08/30/2016 Elsevier Patient Education  2020 [employer].

## 2019-12-06 NOTE — H&P (Signed)
Jeremy Chavez is an 69 y.o. male.   Chief Complaint: Patient is here for esophagogastroduodenoscopy to assess and treat esophageal varices and colonoscopy HPI: Patient is 69 year old Caucasian male who has cirrhosis secondary to NASH complicated by ascites hepatic encephalopathy who is undergoing esophagogastroduodenoscopy to assess and banded esophageal varices of the increase of diet since last EGD of 6 months ago.  He denies heartburn nausea vomiting dysphagia or melena.  He has a history of colonic polyps.  Last exam was 5 years ago with poor prep in [facility].  He has history of colonic polyps.  He denies rectal bleeding. CBC 2 days ago revealed WBC of 3.4 hemoglobin of 8.7 and platelet count of 38K  Past Medical History:  Diagnosis Date  . Diabetes ([place])   . Hypothyroid   . NAFLD (nonalcoholic fatty liver disease) with ascites and encephalopathy.        Pancytopenia secondary to cirrhosis.    Past Surgical History:  Procedure Laterality Date  . carpal tunnel rt hand    . ESOPHAGOGASTRODUODENOSCOPY N/A 02/10/2018   Procedure: ESOPHAGOGASTRODUODENOSCOPY (EGD);  Surgeon: Rogene Houston, MD;  [facility];  Service: Endoscopy;  Laterality: N/A;  2:10  . ESOPHAGOGASTRODUODENOSCOPY N/A 05/24/2019   Procedure: ESOPHAGOGASTRODUODENOSCOPY (EGD);  Surgeon: Rogene Houston, MD;  [facility];  Service: Endoscopy;  Laterality: N/A;  712  . umblical hernia repair      Family History  Problem Relation Age of Onset  . Liver cancer Father    Social History:  reports that he quit smoking about 22 years ago. His smoking use included cigarettes. He smoked 1.50 packs per day. He has never used smokeless tobacco. He reports that he does not drink alcohol or use drugs.  Allergies: No Known Allergies  Medications Prior to Admission  Medication Sig Dispense Refill  . amoxicillin (AMOXIL) 500 MG capsule Take 500 mg by mouth 3 (three) times daily.    Marland Kitchen  aspirin-sod bicarb-citric acid (ALKA-SELTZER) 325 MG TBEF tablet Take 325 mg by mouth daily as needed (indigestion).    . cetirizine (ZYRTEC) 10 MG tablet Take 10 mg by mouth at bedtime.    . Cholecalciferol (VITAMIN D3) 125 MCG (5000 UT) CAPS Take 1 capsule (5,000 Units total) by mouth daily. (Patient taking differently: Take 5,000 Units by mouth every evening. ) 90 capsule 1  . ferrous sulfate 325 (65 FE) MG tablet Take 325 mg by mouth 2 (two) times daily.    . furosemide (LASIX) 20 MG tablet Take 40 mg by mouth daily after breakfast.     . Homeopathic Products (THERAWORX RELIEF) FOAM Apply 1 application topically at bedtime as needed (pain/cramps).    . lactulose (CHRONULAC) 10 GM/15ML solution Take 30 g by mouth 4 (four) times daily.     Marland Kitchen levothyroxine (SYNTHROID) 125 MCG tablet Take 1 tablet (125 mcg total) by mouth daily before breakfast. 90 tablet 1  . lisinopril-hydrochlorothiazide (PRINZIDE,ZESTORETIC) 20-25 MG tablet Take 1 tablet by mouth every evening.     . pantoprazole (PROTONIX) 40 MG tablet Take 1 tablet (40 mg total) by mouth 2 (two) times daily before a meal. 180 tablet 1  . sucralfate (CARAFATE) 1 g tablet Take 1 tablet (1 g total) by mouth 4 (four) times daily -  with meals and at bedtime. 120 tablet 2    Results for orders placed or performed in visit on 10/08/19 (from the past 48 hour(s))  CBC with Differential  Status: Abnormal   Collection Time: 12/04/19 12:36 PM  Result Value Ref Range   WBC 3.4 (L) 3.8 - 10.8 Thousand/uL   RBC 2.66 (L) 4.20 - 5.80 Million/uL   Hemoglobin 8.7 (L) 13.2 - 17.1 g/dL   HCT 25.3 (L) 38.5 - 50.0 %   MCV 95.1 80.0 - 100.0 fL   MCH 32.7 27.0 - 33.0 pg   MCHC 34.4 32.0 - 36.0 g/dL   RDW 13.4 11.0 - 15.0 %   Platelets 38 (L) 140 - 400 Thousand/uL   MPV 11.5 7.5 - 12.5 fL   Neutro Abs 2,390 1,500 - 7,800 cells/uL   Lymphs Abs 666 (L) 850 - 3,900 cells/uL   Absolute Monocytes 262 200 - 950 cells/uL   Eosinophils Absolute 71 15 - 500  cells/uL   Basophils Absolute 10 0 - 200 cells/uL   Neutrophils Relative % 70.3 %   Total Lymphocyte 19.6 %   Monocytes Relative 7.7 %   Eosinophils Relative 2.1 %   Basophils Relative 0.3 %   Smear Review      Comment: No platelet clumps seen. Review of the peripheral smear reveals decreased numbers of platelets. Review of peripheral smear confirms automated results.   COMPLETE METABOLIC PANEL WITH GFR     Status: Abnormal   Collection Time: 12/04/19 12:36 PM  Result Value Ref Range   Glucose, Bld 107 (H) 65 - 99 mg/dL    Comment: .            Fasting reference interval . For someone without known diabetes, a glucose value between 100 and 125 mg/dL is consistent with prediabetes and should be confirmed with a follow-up test. .    BUN 16 7 - 25 mg/dL   Creat 1.55 (H) 0.70 - 1.25 mg/dL    Comment: For patients >29 years of age, the reference limit for Creatinine is approximately 13% higher for people identified as African-American. .    GFR, Est Non African American 46 (L) > OR = 60 mL/min/1.73m   GFR, Est African American 53 (L) > OR = 60 mL/min/1.775m  BUN/Creatinine Ratio 10 6 - 22 (calc)   Sodium 138 135 - 146 mmol/L   Potassium 4.8 3.5 - 5.3 mmol/L   Chloride 109 98 - 110 mmol/L   CO2 23 20 - 32 mmol/L   Calcium 10.5 (H) 8.6 - 10.3 mg/dL   Total Protein 5.6 (L) 6.1 - 8.1 g/dL   Albumin 3.0 (L) 3.6 - 5.1 g/dL   Globulin 2.6 1.9 - 3.7 g/dL (calc)   AG Ratio 1.2 1.0 - 2.5 (calc)   Total Bilirubin 1.7 (H) 0.2 - 1.2 mg/dL   Alkaline phosphatase (APISO) 127 35 - 144 U/L   AST 45 (H) 10 - 35 U/L   ALT 21 9 - 46 U/L   No results found.  Review of Systems  Blood pressure 108/61, pulse 90, temperature 98.4 F (36.9 C), temperature source Oral, resp. rate (!) 8, height 6' 1.5" (1.867 m), SpO2 99 %. Physical Exam  Constitutional: He appears well-developed and well-nourished.  HENT:  Mouth/Throat: Oropharynx is clear and moist.  Eyes: Conjunctivae are normal. No  scleral icterus.  Neck: No thyromegaly present.  Cardiovascular: Normal rate and regular rhythm.  Murmur heard. Grade 2/6 systolic murmur heard all over the precordium.  Respiratory: Effort normal and breath sounds normal.  GI:  Abdomen is distended but not tense.  On palpation soft and nontender.  Difficult to palpate liver or spleen.  Musculoskeletal:        General: Edema present.     Comments: 2+ pitting edema involving both legs.  Lymphadenopathy:    He has no cervical adenopathy.  Neurological: He is alert.  Skin: Skin is warm and dry.     Assessment/Plan Cirrhosis secondary to NASH. History of colonic polyps. Esophagogastroduodenoscopy with possible esophageal variceal banding and surveillance colonoscopy.  Hildred Laser, MD 12/06/2019, 7:32 AM

## 2019-12-31 ENCOUNTER — Other Ambulatory Visit: Payer: Self-pay | Admitting: "Endocrinology

## 2020-01-03 DIAGNOSIS — R69 Illness, unspecified: Secondary | ICD-10-CM | POA: Diagnosis not present

## 2020-01-04 ENCOUNTER — Ambulatory Visit: Payer: Medicare HMO | Admitting: "Endocrinology

## 2020-01-14 DIAGNOSIS — E039 Hypothyroidism, unspecified: Secondary | ICD-10-CM | POA: Diagnosis not present

## 2020-01-14 DIAGNOSIS — Z299 Encounter for prophylactic measures, unspecified: Secondary | ICD-10-CM | POA: Diagnosis not present

## 2020-01-14 DIAGNOSIS — I10 Essential (primary) hypertension: Secondary | ICD-10-CM | POA: Diagnosis not present

## 2020-01-14 DIAGNOSIS — K7460 Unspecified cirrhosis of liver: Secondary | ICD-10-CM | POA: Diagnosis not present

## 2020-01-14 DIAGNOSIS — E7800 Pure hypercholesterolemia, unspecified: Secondary | ICD-10-CM | POA: Diagnosis not present

## 2020-01-16 DIAGNOSIS — K7469 Other cirrhosis of liver: Secondary | ICD-10-CM | POA: Diagnosis not present

## 2020-01-16 DIAGNOSIS — Z8639 Personal history of other endocrine, nutritional and metabolic disease: Secondary | ICD-10-CM | POA: Diagnosis not present

## 2020-01-16 DIAGNOSIS — K766 Portal hypertension: Secondary | ICD-10-CM | POA: Diagnosis not present

## 2020-01-16 DIAGNOSIS — N189 Chronic kidney disease, unspecified: Secondary | ICD-10-CM | POA: Diagnosis not present

## 2020-01-16 DIAGNOSIS — E440 Moderate protein-calorie malnutrition: Secondary | ICD-10-CM | POA: Diagnosis not present

## 2020-01-16 DIAGNOSIS — K7581 Nonalcoholic steatohepatitis (NASH): Secondary | ICD-10-CM | POA: Diagnosis not present

## 2020-01-16 DIAGNOSIS — R188 Other ascites: Secondary | ICD-10-CM | POA: Diagnosis not present

## 2020-01-16 DIAGNOSIS — D684 Acquired coagulation factor deficiency: Secondary | ICD-10-CM | POA: Diagnosis not present

## 2020-01-16 DIAGNOSIS — K7460 Unspecified cirrhosis of liver: Secondary | ICD-10-CM | POA: Diagnosis not present

## 2020-01-16 DIAGNOSIS — R601 Generalized edema: Secondary | ICD-10-CM | POA: Diagnosis not present

## 2020-01-16 DIAGNOSIS — R300 Dysuria: Secondary | ICD-10-CM | POA: Diagnosis not present

## 2020-01-16 DIAGNOSIS — D649 Anemia, unspecified: Secondary | ICD-10-CM | POA: Diagnosis not present

## 2020-01-16 DIAGNOSIS — R8281 Pyuria: Secondary | ICD-10-CM | POA: Diagnosis not present

## 2020-01-16 DIAGNOSIS — N10 Acute tubulo-interstitial nephritis: Secondary | ICD-10-CM | POA: Diagnosis not present

## 2020-01-16 DIAGNOSIS — N178 Other acute kidney failure: Secondary | ICD-10-CM | POA: Diagnosis not present

## 2020-01-16 DIAGNOSIS — E778 Other disorders of glycoprotein metabolism: Secondary | ICD-10-CM | POA: Diagnosis not present

## 2020-01-16 DIAGNOSIS — I509 Heart failure, unspecified: Secondary | ICD-10-CM | POA: Diagnosis not present

## 2020-01-16 DIAGNOSIS — N390 Urinary tract infection, site not specified: Secondary | ICD-10-CM | POA: Diagnosis not present

## 2020-01-16 DIAGNOSIS — E1165 Type 2 diabetes mellitus with hyperglycemia: Secondary | ICD-10-CM | POA: Diagnosis not present

## 2020-01-16 DIAGNOSIS — N179 Acute kidney failure, unspecified: Secondary | ICD-10-CM | POA: Diagnosis not present

## 2020-01-16 DIAGNOSIS — E872 Acidosis: Secondary | ICD-10-CM | POA: Diagnosis not present

## 2020-01-18 DIAGNOSIS — I509 Heart failure, unspecified: Secondary | ICD-10-CM | POA: Diagnosis not present

## 2020-01-18 DIAGNOSIS — E1165 Type 2 diabetes mellitus with hyperglycemia: Secondary | ICD-10-CM | POA: Diagnosis not present

## 2020-01-28 ENCOUNTER — Other Ambulatory Visit: Payer: Self-pay

## 2020-01-28 MED ORDER — TORSEMIDE 20 MG PO TABS
40.00 | ORAL_TABLET | ORAL | Status: DC
Start: 2020-01-26 — End: 2020-01-28

## 2020-01-28 MED ORDER — GENERIC EXTERNAL MEDICATION
125.00 | Status: DC
Start: 2020-01-27 — End: 2020-01-28

## 2020-01-28 MED ORDER — CIPROFLOXACIN HCL 250 MG PO TABS
250.00 | ORAL_TABLET | ORAL | Status: DC
Start: 2020-01-27 — End: 2020-01-28

## 2020-01-28 MED ORDER — TORSEMIDE 20 MG PO TABS
40.00 | ORAL_TABLET | ORAL | Status: DC
Start: 2020-01-27 — End: 2020-01-28

## 2020-01-28 MED ORDER — LIDOCAINE HCL 1 % IJ SOLN
0.50 | INTRAMUSCULAR | Status: DC
Start: ? — End: 2020-01-28

## 2020-01-28 MED ORDER — LACTULOSE 10 GM/15ML PO SOLN
45.00 | ORAL | Status: DC
Start: 2020-01-26 — End: 2020-01-28

## 2020-01-28 MED ORDER — SODIUM BICARBONATE 650 MG PO TABS
1300.00 | ORAL_TABLET | ORAL | Status: DC
Start: 2020-01-26 — End: 2020-01-28

## 2020-01-28 MED ORDER — PREDNISONE 20 MG PO TABS
60.00 | ORAL_TABLET | ORAL | Status: DC
Start: 2020-01-27 — End: 2020-01-28

## 2020-01-28 MED ORDER — PANTOPRAZOLE SODIUM 40 MG PO TBEC
40.00 | DELAYED_RELEASE_TABLET | ORAL | Status: DC
Start: 2020-01-26 — End: 2020-01-28

## 2020-01-28 NOTE — Patient Outreach (Signed)
[facility]) Care Management  01/28/2020  Jeremy Chavez [birth year] [id number]     Transition of Care Referral  Referral Date: 01/28/2020 Referral Source: [facility] Discharge Report Date of Discharge: 01/26/2020 Facility: [place]: [facility]    Outreach attempt #1 to patient. Spoke with spouse who reports patient is currently not at home. She states he has been doing well since discharge home. Spouse confirms patient has all his meds and no issues or concerns regarding them. She voices that he has follow up appt at [facility] on Wed and with PCP on Thurs. Spouse confirms PCP who does their own TOC. She denies any transportation issues and voices their children take them to appts. Spouse advised to have patient contact RN Cm for any issus or concerns.     Plan: RN CM will close case at this time.    Enzo Montgomery, RN,BSN,CCM [place] Management Telephonic Care Management Coordinator Direct Phone: [phone] Toll Free: [phone] Fax: [phone]

## 2020-01-30 DIAGNOSIS — E8770 Fluid overload, unspecified: Secondary | ICD-10-CM | POA: Diagnosis not present

## 2020-01-30 DIAGNOSIS — N189 Chronic kidney disease, unspecified: Secondary | ICD-10-CM | POA: Diagnosis not present

## 2020-01-30 DIAGNOSIS — Z7682 Awaiting organ transplant status: Secondary | ICD-10-CM | POA: Diagnosis not present

## 2020-01-30 DIAGNOSIS — N179 Acute kidney failure, unspecified: Secondary | ICD-10-CM | POA: Diagnosis not present

## 2020-01-30 DIAGNOSIS — E872 Acidosis: Secondary | ICD-10-CM | POA: Diagnosis not present

## 2020-01-31 DIAGNOSIS — Z299 Encounter for prophylactic measures, unspecified: Secondary | ICD-10-CM | POA: Diagnosis not present

## 2020-01-31 DIAGNOSIS — N185 Chronic kidney disease, stage 5: Secondary | ICD-10-CM | POA: Diagnosis not present

## 2020-01-31 DIAGNOSIS — I10 Essential (primary) hypertension: Secondary | ICD-10-CM | POA: Diagnosis not present

## 2020-01-31 DIAGNOSIS — K7460 Unspecified cirrhosis of liver: Secondary | ICD-10-CM | POA: Diagnosis not present

## 2020-01-31 DIAGNOSIS — E039 Hypothyroidism, unspecified: Secondary | ICD-10-CM | POA: Diagnosis not present

## 2020-02-04 DIAGNOSIS — N189 Chronic kidney disease, unspecified: Secondary | ICD-10-CM | POA: Diagnosis not present

## 2020-02-04 DIAGNOSIS — R188 Other ascites: Secondary | ICD-10-CM | POA: Diagnosis not present

## 2020-02-04 DIAGNOSIS — K7290 Hepatic failure, unspecified without coma: Secondary | ICD-10-CM | POA: Diagnosis not present

## 2020-02-04 DIAGNOSIS — K766 Portal hypertension: Secondary | ICD-10-CM | POA: Diagnosis not present

## 2020-02-04 DIAGNOSIS — E870 Hyperosmolality and hypernatremia: Secondary | ICD-10-CM | POA: Diagnosis not present

## 2020-02-04 DIAGNOSIS — N183 Chronic kidney disease, stage 3 unspecified: Secondary | ICD-10-CM | POA: Diagnosis not present

## 2020-02-04 DIAGNOSIS — K7581 Nonalcoholic steatohepatitis (NASH): Secondary | ICD-10-CM | POA: Diagnosis not present

## 2020-02-04 DIAGNOSIS — K6389 Other specified diseases of intestine: Secondary | ICD-10-CM | POA: Diagnosis not present

## 2020-02-04 DIAGNOSIS — N179 Acute kidney failure, unspecified: Secondary | ICD-10-CM | POA: Diagnosis not present

## 2020-02-04 DIAGNOSIS — Z4659 Encounter for fitting and adjustment of other gastrointestinal appliance and device: Secondary | ICD-10-CM | POA: Diagnosis not present

## 2020-02-04 DIAGNOSIS — K7460 Unspecified cirrhosis of liver: Secondary | ICD-10-CM | POA: Diagnosis not present

## 2020-02-04 DIAGNOSIS — N12 Tubulo-interstitial nephritis, not specified as acute or chronic: Secondary | ICD-10-CM | POA: Diagnosis not present

## 2020-02-04 DIAGNOSIS — R1312 Dysphagia, oropharyngeal phase: Secondary | ICD-10-CM | POA: Diagnosis not present

## 2020-02-04 DIAGNOSIS — N10 Acute tubulo-interstitial nephritis: Secondary | ICD-10-CM | POA: Diagnosis not present

## 2020-02-04 DIAGNOSIS — Z8639 Personal history of other endocrine, nutritional and metabolic disease: Secondary | ICD-10-CM | POA: Diagnosis not present

## 2020-02-04 DIAGNOSIS — N2581 Secondary hyperparathyroidism of renal origin: Secondary | ICD-10-CM | POA: Diagnosis not present

## 2020-02-04 DIAGNOSIS — K7469 Other cirrhosis of liver: Secondary | ICD-10-CM | POA: Diagnosis not present

## 2020-02-04 DIAGNOSIS — R8281 Pyuria: Secondary | ICD-10-CM | POA: Diagnosis not present

## 2020-02-12 MED ORDER — HYDROCORTISONE 2.5 % EX CREA
TOPICAL_CREAM | CUTANEOUS | Status: DC
Start: ? — End: 2020-02-12

## 2020-02-12 MED ORDER — CINACALCET HCL 30 MG PO TABS
30.00 | ORAL_TABLET | ORAL | Status: DC
Start: 2020-02-12 — End: 2020-02-12

## 2020-02-12 MED ORDER — RIFAXIMIN 550 MG PO TABS
550.00 | ORAL_TABLET | ORAL | Status: DC
Start: 2020-02-11 — End: 2020-02-12

## 2020-02-12 MED ORDER — TORSEMIDE 20 MG PO TABS
20.00 | ORAL_TABLET | ORAL | Status: DC
Start: 2020-02-12 — End: 2020-02-12

## 2020-02-12 MED ORDER — LACTULOSE 10 GM/15ML PO SOLN
30.00 | ORAL | Status: DC
Start: 2020-02-11 — End: 2020-02-12

## 2020-02-12 MED ORDER — DEXTROSE 50 % IV SOLN
12.50 | INTRAVENOUS | Status: DC
Start: ? — End: 2020-02-12

## 2020-02-12 MED ORDER — CIPROFLOXACIN HCL 250 MG PO TABS
250.00 | ORAL_TABLET | ORAL | Status: DC
Start: 2020-02-12 — End: 2020-02-12

## 2020-02-12 MED ORDER — SODIUM BICARBONATE 650 MG PO TABS
1300.00 | ORAL_TABLET | ORAL | Status: DC
Start: 2020-02-11 — End: 2020-02-12

## 2020-02-12 MED ORDER — GLUCAGON (RDNA) 1 MG IJ KIT
1.00 | PACK | INTRAMUSCULAR | Status: DC
Start: ? — End: 2020-02-12

## 2020-02-12 MED ORDER — ZINC SULFATE 220 (50 ZN) MG PO CAPS
220.00 | ORAL_CAPSULE | ORAL | Status: DC
Start: 2020-02-12 — End: 2020-02-12

## 2020-02-12 MED ORDER — LEVOTHYROXINE SODIUM 125 MCG PO TABS
125.00 | ORAL_TABLET | ORAL | Status: DC
Start: 2020-02-12 — End: 2020-02-12

## 2020-02-12 MED ORDER — PREDNISONE 20 MG PO TABS
20.00 | ORAL_TABLET | ORAL | Status: DC
Start: 2020-02-12 — End: 2020-02-12

## 2020-02-12 MED ORDER — LIDOCAINE HCL 1 % IJ SOLN
0.50 | INTRAMUSCULAR | Status: DC
Start: ? — End: 2020-02-12

## 2020-02-12 MED ORDER — PANTOPRAZOLE SODIUM 40 MG PO TBEC
40.00 | DELAYED_RELEASE_TABLET | ORAL | Status: DC
Start: 2020-02-11 — End: 2020-02-12

## 2020-02-13 ENCOUNTER — Other Ambulatory Visit: Payer: Self-pay

## 2020-02-13 DIAGNOSIS — I864 Gastric varices: Secondary | ICD-10-CM | POA: Diagnosis not present

## 2020-02-13 DIAGNOSIS — Z4659 Encounter for fitting and adjustment of other gastrointestinal appliance and device: Secondary | ICD-10-CM | POA: Diagnosis not present

## 2020-02-13 DIAGNOSIS — F54 Psychological and behavioral factors associated with disorders or diseases classified elsewhere: Secondary | ICD-10-CM | POA: Diagnosis not present

## 2020-02-13 DIAGNOSIS — R4020 Unspecified coma: Secondary | ICD-10-CM | POA: Diagnosis not present

## 2020-02-13 DIAGNOSIS — N179 Acute kidney failure, unspecified: Secondary | ICD-10-CM | POA: Diagnosis not present

## 2020-02-13 DIAGNOSIS — E871 Hypo-osmolality and hyponatremia: Secondary | ICD-10-CM | POA: Diagnosis not present

## 2020-02-13 DIAGNOSIS — Z01818 Encounter for other preprocedural examination: Secondary | ICD-10-CM | POA: Diagnosis not present

## 2020-02-13 DIAGNOSIS — E213 Hyperparathyroidism, unspecified: Secondary | ICD-10-CM | POA: Diagnosis not present

## 2020-02-13 DIAGNOSIS — E440 Moderate protein-calorie malnutrition: Secondary | ICD-10-CM | POA: Diagnosis not present

## 2020-02-13 DIAGNOSIS — Z8639 Personal history of other endocrine, nutritional and metabolic disease: Secondary | ICD-10-CM | POA: Diagnosis not present

## 2020-02-13 DIAGNOSIS — N189 Chronic kidney disease, unspecified: Secondary | ICD-10-CM | POA: Diagnosis not present

## 2020-02-13 DIAGNOSIS — K7460 Unspecified cirrhosis of liver: Secondary | ICD-10-CM | POA: Diagnosis not present

## 2020-02-13 DIAGNOSIS — K7581 Nonalcoholic steatohepatitis (NASH): Secondary | ICD-10-CM | POA: Diagnosis not present

## 2020-02-13 DIAGNOSIS — N10 Acute tubulo-interstitial nephritis: Secondary | ICD-10-CM | POA: Diagnosis not present

## 2020-02-13 DIAGNOSIS — R1312 Dysphagia, oropharyngeal phase: Secondary | ICD-10-CM | POA: Diagnosis not present

## 2020-02-13 DIAGNOSIS — I10 Essential (primary) hypertension: Secondary | ICD-10-CM | POA: Diagnosis not present

## 2020-02-13 DIAGNOSIS — I8510 Secondary esophageal varices without bleeding: Secondary | ICD-10-CM | POA: Diagnosis not present

## 2020-02-13 DIAGNOSIS — K7469 Other cirrhosis of liver: Secondary | ICD-10-CM | POA: Diagnosis not present

## 2020-02-13 DIAGNOSIS — E039 Hypothyroidism, unspecified: Secondary | ICD-10-CM | POA: Diagnosis not present

## 2020-02-13 DIAGNOSIS — I8500 Esophageal varices without bleeding: Secondary | ICD-10-CM | POA: Diagnosis not present

## 2020-02-13 DIAGNOSIS — K7290 Hepatic failure, unspecified without coma: Secondary | ICD-10-CM | POA: Diagnosis not present

## 2020-02-13 DIAGNOSIS — D649 Anemia, unspecified: Secondary | ICD-10-CM | POA: Diagnosis not present

## 2020-02-13 DIAGNOSIS — R188 Other ascites: Secondary | ICD-10-CM | POA: Diagnosis not present

## 2020-02-13 DIAGNOSIS — R4182 Altered mental status, unspecified: Secondary | ICD-10-CM | POA: Diagnosis not present

## 2020-02-13 DIAGNOSIS — K766 Portal hypertension: Secondary | ICD-10-CM | POA: Diagnosis not present

## 2020-02-13 DIAGNOSIS — R161 Splenomegaly, not elsewhere classified: Secondary | ICD-10-CM | POA: Diagnosis not present

## 2020-02-13 DIAGNOSIS — R140 Abdominal distension (gaseous): Secondary | ICD-10-CM | POA: Diagnosis not present

## 2020-02-13 DIAGNOSIS — N184 Chronic kidney disease, stage 4 (severe): Secondary | ICD-10-CM | POA: Diagnosis not present

## 2020-02-13 DIAGNOSIS — R000 Tachycardia, unspecified: Secondary | ICD-10-CM | POA: Diagnosis not present

## 2020-02-13 DIAGNOSIS — I509 Heart failure, unspecified: Secondary | ICD-10-CM | POA: Diagnosis not present

## 2020-02-13 NOTE — Patient Outreach (Signed)
[place] [facility]) Care Management  02/13/2020  PONCIANO SHEALY 08-21-1951 [id number]   Referral Date: 02/13/20 Referral Source: [employer] Report Date of Discharge: 02/11/20 Facility:  [place]: [place] attempt: Spoke with patient wife who states that she cannot talk right now but reports that they have had to bring the patient to the ER and that they are at [place] her that CM would follow up another time. She verbalized understanding.    Plan: RN CM will monitor to see patient disposition and outreach as appropriate.   Jone Baseman, RN, MSN [facility] Care Management Care Management Coordinator Direct Line [phone] Toll Free: [phone]  Fax: [phone]

## 2020-02-14 ENCOUNTER — Other Ambulatory Visit: Payer: Self-pay

## 2020-02-14 MED ORDER — CINACALCET HCL 30 MG PO TABS
30.00 | ORAL_TABLET | ORAL | Status: DC
Start: 2020-02-15 — End: 2020-02-14

## 2020-02-14 MED ORDER — GENERIC EXTERNAL MEDICATION
Status: DC
Start: ? — End: 2020-02-14

## 2020-02-14 MED ORDER — GENERIC EXTERNAL MEDICATION
5000.00 | Status: DC
Start: 2020-02-25 — End: 2020-02-14

## 2020-02-14 MED ORDER — METHYLPREDNISOLONE SODIUM SUCC 2000 MG IJ SOLR
16.00 | INTRAMUSCULAR | Status: DC
Start: 2020-02-16 — End: 2020-02-14

## 2020-02-14 MED ORDER — GENERIC EXTERNAL MEDICATION
2.00 | Status: DC
Start: 2020-02-16 — End: 2020-02-14

## 2020-02-14 MED ORDER — LACTULOSE 10 GM/15ML PO SOLN
300.00 | ORAL | Status: DC
Start: ? — End: 2020-02-14

## 2020-02-14 MED ORDER — SODIUM BICARBONATE 650 MG PO TABS
1300.00 | ORAL_TABLET | ORAL | Status: DC
Start: 2020-02-14 — End: 2020-02-14

## 2020-02-14 MED ORDER — LACTULOSE 10 GM/15ML PO SOLN
300.00 | ORAL | Status: DC
Start: 2020-02-14 — End: 2020-02-14

## 2020-02-14 MED ORDER — LACTATED RINGERS IV SOLN
INTRAVENOUS | Status: DC
Start: ? — End: 2020-02-14

## 2020-02-14 MED ORDER — PANTOPRAZOLE SODIUM 40 MG PO TBEC
40.00 | DELAYED_RELEASE_TABLET | ORAL | Status: DC
Start: 2020-02-25 — End: 2020-02-14

## 2020-02-14 MED ORDER — LACTULOSE 10 GM/15ML PO SOLN
30.00 | ORAL | Status: DC
Start: 2020-02-15 — End: 2020-02-14

## 2020-02-14 MED ORDER — GENERIC EXTERNAL MEDICATION
125.00 | Status: DC
Start: 2020-02-15 — End: 2020-02-14

## 2020-02-14 MED ORDER — GENERIC EXTERNAL MEDICATION
550.00 | Status: DC
Start: 2020-02-15 — End: 2020-02-14

## 2020-02-14 MED ORDER — LIDOCAINE HCL 1 % IJ SOLN
0.50 | INTRAMUSCULAR | Status: DC
Start: ? — End: 2020-02-14

## 2020-02-14 MED ORDER — ZINC SULFATE 220 (50 ZN) MG PO CAPS
220.00 | ORAL_CAPSULE | ORAL | Status: DC
Start: 2020-02-16 — End: 2020-02-14

## 2020-02-14 MED ORDER — FERROUS SULFATE 324 MG PO TBEC
324.00 | DELAYED_RELEASE_TABLET | ORAL | Status: DC
Start: 2020-02-25 — End: 2020-02-14

## 2020-02-14 NOTE — Patient Outreach (Signed)
[facility]) Care Management  02/14/2020  ADOLPHE FORTUNATO Oct 01, 1950 [id number]   Referral Date: 02/13/20 Referral Source: [employer] Report Date of Discharge: 02/11/20 Facility:  [place]: [facility]  Patient now admitted to [facility] with Altered Mental Status.    Plan: RN CM  Will follow hospitalization and outreach when appropriate.    Jone Baseman, RN, MSN [place] Management Care Management Coordinator Direct Line [phone] Cell [phone] Toll Free: [phone]  Fax: [phone]

## 2020-02-15 MED ORDER — GENERIC EXTERNAL MEDICATION
125.00 | Status: DC
Start: 2020-02-16 — End: 2020-02-15

## 2020-02-15 MED ORDER — SODIUM BICARBONATE 650 MG PO TABS
1300.00 | ORAL_TABLET | ORAL | Status: DC
Start: 2020-02-15 — End: 2020-02-15

## 2020-02-18 DIAGNOSIS — E039 Hypothyroidism, unspecified: Secondary | ICD-10-CM | POA: Diagnosis not present

## 2020-02-18 DIAGNOSIS — I10 Essential (primary) hypertension: Secondary | ICD-10-CM | POA: Diagnosis not present

## 2020-02-19 MED ORDER — GENERIC EXTERNAL MEDICATION
125.00 | Status: DC
Start: 2020-02-25 — End: 2020-02-19

## 2020-02-19 MED ORDER — RIFAXIMIN 550 MG PO TABS
550.00 | ORAL_TABLET | ORAL | Status: DC
Start: 2020-02-25 — End: 2020-02-19

## 2020-02-19 MED ORDER — ZINC SULFATE 220 (50 ZN) MG PO CAPS
220.00 | ORAL_CAPSULE | ORAL | Status: DC
Start: 2020-02-25 — End: 2020-02-19

## 2020-02-19 MED ORDER — PREDNISONE 20 MG PO TABS
20.00 | ORAL_TABLET | ORAL | Status: DC
Start: 2020-02-20 — End: 2020-02-19

## 2020-02-19 MED ORDER — CIPROFLOXACIN HCL 250 MG PO TABS
250.00 | ORAL_TABLET | ORAL | Status: DC
Start: 2020-02-25 — End: 2020-02-19

## 2020-02-19 MED ORDER — LACTULOSE 10 GM/15ML PO SOLN
30.00 | ORAL | Status: DC
Start: 2020-02-22 — End: 2020-02-19

## 2020-02-19 MED ORDER — SODIUM BICARBONATE 650 MG PO TABS
1300.00 | ORAL_TABLET | ORAL | Status: DC
Start: 2020-02-24 — End: 2020-02-19

## 2020-02-19 MED ORDER — MELATONIN 3 MG PO TABS
6.00 | ORAL_TABLET | ORAL | Status: DC
Start: 2020-02-24 — End: 2020-02-19

## 2020-02-21 MED ORDER — GENERIC EXTERNAL MEDICATION
15.00 | Status: DC
Start: 2020-02-25 — End: 2020-02-21

## 2020-02-25 ENCOUNTER — Other Ambulatory Visit: Payer: Self-pay

## 2020-02-25 MED ORDER — CLOTRIMAZOLE 1 % EX CREA
TOPICAL_CREAM | CUTANEOUS | Status: DC
Start: 2020-02-25 — End: 2020-02-25

## 2020-02-25 MED ORDER — LACTULOSE 10 GM/15ML PO SOLN
30.00 | ORAL | Status: DC
Start: 2020-02-25 — End: 2020-02-25

## 2020-02-25 NOTE — Patient Outreach (Addendum)
[facility]) Care Management  02/25/2020  CHADRICK SPRINKLE [birth year] [id number]   Telephone call to patient.  Spouse answers and acknowledges that patient was discharged on yesterday. She states patient is doing ok since being home. Asked about follow up appointments, she states that patient has follow up appointment at due next week.  She states that she and patient will be staying in [place] with son and his family for the time being.  She declines follow up with PCP being that patient goes to [place] so much and is due to go next week.  She reports that they do have transportation to MD appointments. MD office completes TOC calls.  Discussed [facility] nurse follow up calls. She declines calls for now.    Plan: RN CM will close case and send letter.  Jone Baseman, RN, MSN [place] Management Care Management Coordinator Direct Line [phone] Cell [phone] Toll Free: [phone]  Fax: [phone]

## 2020-02-26 ENCOUNTER — Other Ambulatory Visit (HOSPITAL_COMMUNITY): Payer: Self-pay | Admitting: Internal Medicine

## 2020-02-26 ENCOUNTER — Other Ambulatory Visit: Payer: Self-pay | Admitting: Internal Medicine

## 2020-02-26 DIAGNOSIS — K7469 Other cirrhosis of liver: Secondary | ICD-10-CM

## 2020-02-26 DIAGNOSIS — Z7682 Awaiting organ transplant status: Secondary | ICD-10-CM

## 2020-02-26 DIAGNOSIS — Z01818 Encounter for other preprocedural examination: Secondary | ICD-10-CM

## 2020-02-29 ENCOUNTER — Encounter (HOSPITAL_COMMUNITY): Payer: Self-pay

## 2020-02-29 ENCOUNTER — Encounter (HOSPITAL_COMMUNITY)
Admission: RE | Admit: 2020-02-29 | Discharge: 2020-02-29 | Disposition: A | Discharge status: Home / Self Care | Payer: Medicare HMO | Source: Ambulatory Visit | Attending: Internal Medicine | Admitting: Internal Medicine

## 2020-02-29 ENCOUNTER — Ambulatory Visit (HOSPITAL_COMMUNITY)
Admission: RE | Admit: 2020-02-29 | Discharge: 2020-02-29 | Disposition: A | Discharge status: Home / Self Care | Payer: Medicare HMO | Source: Ambulatory Visit | Attending: Internal Medicine | Admitting: Internal Medicine

## 2020-02-29 ENCOUNTER — Other Ambulatory Visit: Payer: Self-pay

## 2020-02-29 DIAGNOSIS — Z01818 Encounter for other preprocedural examination: Secondary | ICD-10-CM

## 2020-02-29 DIAGNOSIS — K7469 Other cirrhosis of liver: Secondary | ICD-10-CM | POA: Diagnosis not present

## 2020-02-29 DIAGNOSIS — R188 Other ascites: Secondary | ICD-10-CM | POA: Diagnosis not present

## 2020-02-29 DIAGNOSIS — Z7682 Awaiting organ transplant status: Secondary | ICD-10-CM | POA: Diagnosis not present

## 2020-02-29 DIAGNOSIS — Z7982 Long term (current) use of aspirin: Secondary | ICD-10-CM | POA: Insufficient documentation

## 2020-02-29 LAB — COMPREHENSIVE METABOLIC PANEL
ALT: 49 U/L — ABNORMAL HIGH (ref 0–44)
AST: 46 U/L — ABNORMAL HIGH (ref 15–41)
Albumin: 4.1 g/dL (ref 3.5–5.0)
Alkaline Phosphatase: 147 U/L — ABNORMAL HIGH (ref 38–126)
Anion gap: 16 — ABNORMAL HIGH (ref 5–15)
BUN: 118 mg/dL — ABNORMAL HIGH (ref 8–23)
CO2: 23 mmol/L (ref 22–32)
Calcium: 8.9 mg/dL (ref 8.9–10.3)
Chloride: 96 mmol/L — ABNORMAL LOW (ref 98–111)
Creatinine, Ser: 3.88 mg/dL — ABNORMAL HIGH (ref 0.61–1.24)
GFR calc Af Amer: 17 mL/min — ABNORMAL LOW (ref >60)
GFR calc non Af Amer: 15 mL/min — ABNORMAL LOW (ref >60)
Glucose, Bld: 167 mg/dL — ABNORMAL HIGH (ref 70–99)
Potassium: 3 mmol/L — ABNORMAL LOW (ref 3.5–5.1)
Sodium: 135 mmol/L (ref 135–145)
Total Bilirubin: 2.5 mg/dL — ABNORMAL HIGH (ref 0.3–1.2)
Total Protein: 6.1 g/dL — ABNORMAL LOW (ref 6.5–8.1)

## 2020-02-29 LAB — PROTIME-INR
INR: 1.4 — ABNORMAL HIGH (ref 0.8–1.2)
Prothrombin Time: 16.2 seconds — ABNORMAL HIGH (ref 11.4–15.2)

## 2020-02-29 MED ORDER — ALBUMIN HUMAN 25 % IV SOLN: 50.0000 g | Freq: Once | INTRAVENOUS | Status: AC

## 2020-02-29 MED ORDER — ALBUMIN HUMAN 25 % IV SOLN
INTRAVENOUS | Status: AC
  Administered 2020-02-29: 50 g via INTRAVENOUS
  Filled 2020-02-29: qty 200

## 2020-02-29 NOTE — Progress Notes (Signed)
Paracentesis complete no signs of distress.

## 2020-02-29 NOTE — Procedures (Signed)
PreOperative Dx: Cirrhosis, NAFLD, ascites Postoperative Dx: Cirrhosis, NAFLD, ascites Procedure:   US guided paracentesis Radiologist:  Thornton Papas Anesthesia:  10 ml of1% lidocaine Specimen:  5 L of yellow ascitic fluid EBL:   < 1 ml Complications: None

## 2020-03-04 ENCOUNTER — Other Ambulatory Visit: Payer: Self-pay

## 2020-03-04 ENCOUNTER — Encounter (HOSPITAL_COMMUNITY): Payer: Self-pay

## 2020-03-04 ENCOUNTER — Ambulatory Visit (HOSPITAL_COMMUNITY)
Admission: RE | Admit: 2020-03-04 | Discharge: 2020-03-04 | Disposition: A | Discharge status: Home / Self Care | Payer: Medicare HMO | Source: Ambulatory Visit | Attending: Internal Medicine | Admitting: Internal Medicine

## 2020-03-04 DIAGNOSIS — Z01818 Encounter for other preprocedural examination: Secondary | ICD-10-CM | POA: Insufficient documentation

## 2020-03-04 DIAGNOSIS — K7469 Other cirrhosis of liver: Secondary | ICD-10-CM | POA: Insufficient documentation

## 2020-03-04 DIAGNOSIS — Z7682 Awaiting organ transplant status: Secondary | ICD-10-CM | POA: Diagnosis not present

## 2020-03-04 DIAGNOSIS — R188 Other ascites: Secondary | ICD-10-CM | POA: Diagnosis not present

## 2020-03-04 MED ORDER — ALBUMIN HUMAN 25 % IV SOLN
INTRAVENOUS | Status: AC
  Filled 2020-03-04: qty 200

## 2020-03-04 MED ORDER — ALBUMIN HUMAN 25 % IV SOLN: 50.0000 g | Freq: Once | INTRAVENOUS | Status: DC

## 2020-03-04 NOTE — Progress Notes (Signed)
Paracentesis complete no signs of distress.

## 2020-03-04 NOTE — Procedures (Signed)
INDICATION: Ascites  EXAM: ULTRASOUND GUIDED left-sided PARACENTESIS  GRIP-IR: Category: Fluids  Subcategory: Paracentesis  Follow-Up: None  MEDICATIONS: None.  COMPLICATIONS: None immediate.  PROCEDURE: Informed written consent was obtained from the patient after a discussion of the risks, benefits and alternatives to treatment. A timeout was performed prior to the initiation of the procedure.  Initial ultrasound scanning demonstrates a large amount of ascites within the left lower abdominal quadrant. The left lower abdomen was prepped and draped in the usual sterile fashion. 1% lidocaine  was used for local anesthesia.   Following this, a Yueh catheter was introduced. An ultrasound image was saved for documentation purposes. The paracentesis was performed. The catheter was removed and a dressing was applied. The patient tolerated the procedure well without immediate post procedural complication.   FINDINGS: A total of approximately 3.9 L of clear yellow fluid was removed.   IMPRESSION:  Successful ultrasound-guided paracentesis yielding 3.9 liters of peritoneal fluid.

## 2020-03-05 DIAGNOSIS — N184 Chronic kidney disease, stage 4 (severe): Secondary | ICD-10-CM | POA: Diagnosis not present

## 2020-03-05 DIAGNOSIS — Z7682 Awaiting organ transplant status: Secondary | ICD-10-CM | POA: Diagnosis not present

## 2020-03-05 DIAGNOSIS — I129 Hypertensive chronic kidney disease with stage 1 through stage 4 chronic kidney disease, or unspecified chronic kidney disease: Secondary | ICD-10-CM | POA: Diagnosis not present

## 2020-03-05 DIAGNOSIS — K7581 Nonalcoholic steatohepatitis (NASH): Secondary | ICD-10-CM | POA: Diagnosis not present

## 2020-03-05 DIAGNOSIS — Z23 Encounter for immunization: Secondary | ICD-10-CM | POA: Diagnosis not present

## 2020-03-05 DIAGNOSIS — E8770 Fluid overload, unspecified: Secondary | ICD-10-CM | POA: Diagnosis not present

## 2020-03-05 DIAGNOSIS — Z01810 Encounter for preprocedural cardiovascular examination: Secondary | ICD-10-CM | POA: Diagnosis not present

## 2020-03-05 DIAGNOSIS — K7469 Other cirrhosis of liver: Secondary | ICD-10-CM | POA: Diagnosis not present

## 2020-03-05 DIAGNOSIS — E1122 Type 2 diabetes mellitus with diabetic chronic kidney disease: Secondary | ICD-10-CM | POA: Diagnosis not present

## 2020-03-05 DIAGNOSIS — K7460 Unspecified cirrhosis of liver: Secondary | ICD-10-CM | POA: Diagnosis not present

## 2020-03-05 DIAGNOSIS — N179 Acute kidney failure, unspecified: Secondary | ICD-10-CM | POA: Diagnosis not present

## 2020-03-05 DIAGNOSIS — R601 Generalized edema: Secondary | ICD-10-CM | POA: Diagnosis not present

## 2020-03-05 DIAGNOSIS — R188 Other ascites: Secondary | ICD-10-CM | POA: Diagnosis not present

## 2020-03-05 DIAGNOSIS — K7290 Hepatic failure, unspecified without coma: Secondary | ICD-10-CM | POA: Diagnosis not present

## 2020-03-05 DIAGNOSIS — N189 Chronic kidney disease, unspecified: Secondary | ICD-10-CM | POA: Diagnosis not present

## 2020-03-05 DIAGNOSIS — Z79899 Other long term (current) drug therapy: Secondary | ICD-10-CM | POA: Diagnosis not present

## 2020-03-05 DIAGNOSIS — K766 Portal hypertension: Secondary | ICD-10-CM | POA: Diagnosis not present

## 2020-03-05 DIAGNOSIS — D61818 Other pancytopenia: Secondary | ICD-10-CM | POA: Diagnosis not present

## 2020-03-06 ENCOUNTER — Ambulatory Visit (HOSPITAL_COMMUNITY): Payer: Medicare HMO

## 2020-03-06 DIAGNOSIS — E1122 Type 2 diabetes mellitus with diabetic chronic kidney disease: Secondary | ICD-10-CM | POA: Diagnosis not present

## 2020-03-06 DIAGNOSIS — E8770 Fluid overload, unspecified: Secondary | ICD-10-CM | POA: Diagnosis not present

## 2020-03-06 DIAGNOSIS — K7581 Nonalcoholic steatohepatitis (NASH): Secondary | ICD-10-CM | POA: Diagnosis not present

## 2020-03-06 DIAGNOSIS — K7460 Unspecified cirrhosis of liver: Secondary | ICD-10-CM | POA: Diagnosis not present

## 2020-03-06 DIAGNOSIS — R188 Other ascites: Secondary | ICD-10-CM | POA: Diagnosis not present

## 2020-03-06 DIAGNOSIS — I129 Hypertensive chronic kidney disease with stage 1 through stage 4 chronic kidney disease, or unspecified chronic kidney disease: Secondary | ICD-10-CM | POA: Diagnosis not present

## 2020-03-06 DIAGNOSIS — K766 Portal hypertension: Secondary | ICD-10-CM | POA: Diagnosis not present

## 2020-03-06 DIAGNOSIS — N184 Chronic kidney disease, stage 4 (severe): Secondary | ICD-10-CM | POA: Diagnosis not present

## 2020-03-06 DIAGNOSIS — D61818 Other pancytopenia: Secondary | ICD-10-CM | POA: Diagnosis not present

## 2020-03-07 DIAGNOSIS — K7460 Unspecified cirrhosis of liver: Secondary | ICD-10-CM | POA: Diagnosis not present

## 2020-03-07 DIAGNOSIS — N184 Chronic kidney disease, stage 4 (severe): Secondary | ICD-10-CM | POA: Diagnosis not present

## 2020-03-07 DIAGNOSIS — K7581 Nonalcoholic steatohepatitis (NASH): Secondary | ICD-10-CM | POA: Diagnosis not present

## 2020-03-07 DIAGNOSIS — E1122 Type 2 diabetes mellitus with diabetic chronic kidney disease: Secondary | ICD-10-CM | POA: Diagnosis not present

## 2020-03-07 DIAGNOSIS — R188 Other ascites: Secondary | ICD-10-CM | POA: Diagnosis not present

## 2020-03-07 DIAGNOSIS — K766 Portal hypertension: Secondary | ICD-10-CM | POA: Diagnosis not present

## 2020-03-07 DIAGNOSIS — E8770 Fluid overload, unspecified: Secondary | ICD-10-CM | POA: Diagnosis not present

## 2020-03-07 DIAGNOSIS — D61818 Other pancytopenia: Secondary | ICD-10-CM | POA: Diagnosis not present

## 2020-03-07 DIAGNOSIS — I129 Hypertensive chronic kidney disease with stage 1 through stage 4 chronic kidney disease, or unspecified chronic kidney disease: Secondary | ICD-10-CM | POA: Diagnosis not present

## 2020-03-11 ENCOUNTER — Ambulatory Visit (HOSPITAL_COMMUNITY): Admission: RE | Admit: 2020-03-11 | Payer: Medicare HMO | Source: Ambulatory Visit

## 2020-03-11 DIAGNOSIS — K922 Gastrointestinal hemorrhage, unspecified: Secondary | ICD-10-CM | POA: Diagnosis not present

## 2020-03-11 DIAGNOSIS — W1839XA Other fall on same level, initial encounter: Secondary | ICD-10-CM | POA: Diagnosis not present

## 2020-03-11 DIAGNOSIS — D689 Coagulation defect, unspecified: Secondary | ICD-10-CM | POA: Diagnosis not present

## 2020-03-11 DIAGNOSIS — R4182 Altered mental status, unspecified: Secondary | ICD-10-CM | POA: Diagnosis not present

## 2020-03-11 DIAGNOSIS — R0989 Other specified symptoms and signs involving the circulatory and respiratory systems: Secondary | ICD-10-CM | POA: Diagnosis not present

## 2020-03-11 DIAGNOSIS — E871 Hypo-osmolality and hyponatremia: Secondary | ICD-10-CM | POA: Diagnosis not present

## 2020-03-11 DIAGNOSIS — K7581 Nonalcoholic steatohepatitis (NASH): Secondary | ICD-10-CM | POA: Diagnosis not present

## 2020-03-11 DIAGNOSIS — R9431 Abnormal electrocardiogram [ECG] [EKG]: Secondary | ICD-10-CM | POA: Diagnosis not present

## 2020-03-11 DIAGNOSIS — K7460 Unspecified cirrhosis of liver: Secondary | ICD-10-CM | POA: Diagnosis not present

## 2020-03-11 DIAGNOSIS — Z4659 Encounter for fitting and adjustment of other gastrointestinal appliance and device: Secondary | ICD-10-CM | POA: Diagnosis not present

## 2020-03-11 DIAGNOSIS — R040 Epistaxis: Secondary | ICD-10-CM | POA: Diagnosis not present

## 2020-03-11 DIAGNOSIS — R5381 Other malaise: Secondary | ICD-10-CM | POA: Diagnosis not present

## 2020-03-11 DIAGNOSIS — S301XXA Contusion of abdominal wall, initial encounter: Secondary | ICD-10-CM | POA: Diagnosis not present

## 2020-03-11 DIAGNOSIS — Z23 Encounter for immunization: Secondary | ICD-10-CM | POA: Diagnosis not present

## 2020-03-11 DIAGNOSIS — J811 Chronic pulmonary edema: Secondary | ICD-10-CM | POA: Diagnosis not present

## 2020-03-11 DIAGNOSIS — S0093XA Contusion of unspecified part of head, initial encounter: Secondary | ICD-10-CM | POA: Diagnosis not present

## 2020-03-11 DIAGNOSIS — K766 Portal hypertension: Secondary | ICD-10-CM | POA: Diagnosis not present

## 2020-03-11 DIAGNOSIS — R0602 Shortness of breath: Secondary | ICD-10-CM | POA: Diagnosis not present

## 2020-03-11 DIAGNOSIS — K7290 Hepatic failure, unspecified without coma: Secondary | ICD-10-CM | POA: Diagnosis not present

## 2020-03-11 DIAGNOSIS — I8510 Secondary esophageal varices without bleeding: Secondary | ICD-10-CM | POA: Diagnosis not present

## 2020-03-11 DIAGNOSIS — I700 Atherosclerosis of aorta: Secondary | ICD-10-CM | POA: Diagnosis not present

## 2020-03-11 DIAGNOSIS — K9429 Other complications of gastrostomy: Secondary | ICD-10-CM | POA: Diagnosis not present

## 2020-03-11 DIAGNOSIS — R188 Other ascites: Secondary | ICD-10-CM | POA: Diagnosis not present

## 2020-03-11 DIAGNOSIS — R112 Nausea with vomiting, unspecified: Secondary | ICD-10-CM | POA: Diagnosis not present

## 2020-03-11 DIAGNOSIS — J9811 Atelectasis: Secondary | ICD-10-CM | POA: Diagnosis not present

## 2020-03-11 DIAGNOSIS — N184 Chronic kidney disease, stage 4 (severe): Secondary | ICD-10-CM | POA: Diagnosis not present

## 2020-03-11 DIAGNOSIS — R140 Abdominal distension (gaseous): Secondary | ICD-10-CM | POA: Diagnosis not present

## 2020-03-13 ENCOUNTER — Ambulatory Visit (HOSPITAL_COMMUNITY): Payer: Medicare HMO

## 2020-03-18 DIAGNOSIS — K7581 Nonalcoholic steatohepatitis (NASH): Secondary | ICD-10-CM | POA: Diagnosis not present

## 2020-03-18 DIAGNOSIS — R5381 Other malaise: Secondary | ICD-10-CM | POA: Diagnosis not present

## 2020-03-18 DIAGNOSIS — K7460 Unspecified cirrhosis of liver: Secondary | ICD-10-CM | POA: Diagnosis not present

## 2020-03-18 DIAGNOSIS — K7290 Hepatic failure, unspecified without coma: Secondary | ICD-10-CM | POA: Diagnosis not present

## 2020-03-19 DIAGNOSIS — I10 Essential (primary) hypertension: Secondary | ICD-10-CM | POA: Diagnosis not present

## 2020-03-19 DIAGNOSIS — K7460 Unspecified cirrhosis of liver: Secondary | ICD-10-CM | POA: Diagnosis not present

## 2020-03-19 DIAGNOSIS — K7581 Nonalcoholic steatohepatitis (NASH): Secondary | ICD-10-CM | POA: Diagnosis not present

## 2020-03-19 DIAGNOSIS — R188 Other ascites: Secondary | ICD-10-CM | POA: Diagnosis not present

## 2020-03-19 DIAGNOSIS — E039 Hypothyroidism, unspecified: Secondary | ICD-10-CM | POA: Diagnosis not present

## 2020-03-25 DIAGNOSIS — K7460 Unspecified cirrhosis of liver: Secondary | ICD-10-CM | POA: Diagnosis not present

## 2020-03-25 DIAGNOSIS — R188 Other ascites: Secondary | ICD-10-CM | POA: Diagnosis not present

## 2020-03-25 DIAGNOSIS — K7581 Nonalcoholic steatohepatitis (NASH): Secondary | ICD-10-CM | POA: Diagnosis not present

## 2020-03-27 DIAGNOSIS — Z4682 Encounter for fitting and adjustment of non-vascular catheter: Secondary | ICD-10-CM | POA: Diagnosis not present

## 2020-03-27 DIAGNOSIS — R188 Other ascites: Secondary | ICD-10-CM | POA: Diagnosis not present

## 2020-03-27 DIAGNOSIS — K7581 Nonalcoholic steatohepatitis (NASH): Secondary | ICD-10-CM | POA: Diagnosis not present

## 2020-03-27 DIAGNOSIS — E8770 Fluid overload, unspecified: Secondary | ICD-10-CM | POA: Diagnosis not present

## 2020-03-27 DIAGNOSIS — R739 Hyperglycemia, unspecified: Secondary | ICD-10-CM | POA: Diagnosis not present

## 2020-03-27 DIAGNOSIS — E118 Type 2 diabetes mellitus with unspecified complications: Secondary | ICD-10-CM | POA: Diagnosis not present

## 2020-03-27 DIAGNOSIS — Z9189 Other specified personal risk factors, not elsewhere classified: Secondary | ICD-10-CM | POA: Diagnosis not present

## 2020-03-27 DIAGNOSIS — K811 Chronic cholecystitis: Secondary | ICD-10-CM | POA: Diagnosis not present

## 2020-03-27 DIAGNOSIS — Z452 Encounter for adjustment and management of vascular access device: Secondary | ICD-10-CM | POA: Diagnosis not present

## 2020-03-27 DIAGNOSIS — E872 Acidosis: Secondary | ICD-10-CM | POA: Diagnosis not present

## 2020-03-27 DIAGNOSIS — I8510 Secondary esophageal varices without bleeding: Secondary | ICD-10-CM | POA: Diagnosis not present

## 2020-03-27 DIAGNOSIS — K831 Obstruction of bile duct: Secondary | ICD-10-CM | POA: Diagnosis not present

## 2020-03-27 DIAGNOSIS — N189 Chronic kidney disease, unspecified: Secondary | ICD-10-CM | POA: Diagnosis not present

## 2020-03-27 DIAGNOSIS — K766 Portal hypertension: Secondary | ICD-10-CM | POA: Diagnosis not present

## 2020-03-27 DIAGNOSIS — M6281 Muscle weakness (generalized): Secondary | ICD-10-CM | POA: Diagnosis not present

## 2020-03-27 DIAGNOSIS — R161 Splenomegaly, not elsewhere classified: Secondary | ICD-10-CM | POA: Diagnosis not present

## 2020-03-27 DIAGNOSIS — Z8639 Personal history of other endocrine, nutritional and metabolic disease: Secondary | ICD-10-CM | POA: Diagnosis not present

## 2020-03-27 DIAGNOSIS — T8643 Liver transplant infection: Secondary | ICD-10-CM | POA: Diagnosis not present

## 2020-03-27 DIAGNOSIS — Z7682 Awaiting organ transplant status: Secondary | ICD-10-CM | POA: Diagnosis not present

## 2020-03-27 DIAGNOSIS — Z79899 Other long term (current) drug therapy: Secondary | ICD-10-CM | POA: Diagnosis not present

## 2020-03-27 DIAGNOSIS — D61818 Other pancytopenia: Secondary | ICD-10-CM | POA: Diagnosis not present

## 2020-03-27 DIAGNOSIS — Z940 Kidney transplant status: Secondary | ICD-10-CM | POA: Diagnosis not present

## 2020-03-27 DIAGNOSIS — N184 Chronic kidney disease, stage 4 (severe): Secondary | ICD-10-CM | POA: Diagnosis not present

## 2020-03-27 DIAGNOSIS — Z792 Long term (current) use of antibiotics: Secondary | ICD-10-CM | POA: Diagnosis not present

## 2020-03-27 DIAGNOSIS — T380X5A Adverse effect of glucocorticoids and synthetic analogues, initial encounter: Secondary | ICD-10-CM | POA: Diagnosis not present

## 2020-03-27 DIAGNOSIS — R5381 Other malaise: Secondary | ICD-10-CM | POA: Diagnosis not present

## 2020-03-27 DIAGNOSIS — D849 Immunodeficiency, unspecified: Secondary | ICD-10-CM | POA: Diagnosis not present

## 2020-03-27 DIAGNOSIS — T8389XA Other specified complication of genitourinary prosthetic devices, implants and grafts, initial encounter: Secondary | ICD-10-CM | POA: Diagnosis not present

## 2020-03-27 DIAGNOSIS — Z949 Transplanted organ and tissue status, unspecified: Secondary | ICD-10-CM | POA: Diagnosis not present

## 2020-03-27 DIAGNOSIS — K7460 Unspecified cirrhosis of liver: Secondary | ICD-10-CM | POA: Diagnosis not present

## 2020-03-27 DIAGNOSIS — K767 Hepatorenal syndrome: Secondary | ICD-10-CM | POA: Diagnosis not present

## 2020-03-27 DIAGNOSIS — T8619 Other complication of kidney transplant: Secondary | ICD-10-CM | POA: Diagnosis not present

## 2020-03-27 DIAGNOSIS — C220 Liver cell carcinoma: Secondary | ICD-10-CM | POA: Diagnosis not present

## 2020-03-27 DIAGNOSIS — Z944 Liver transplant status: Secondary | ICD-10-CM | POA: Diagnosis not present

## 2020-03-27 DIAGNOSIS — B1920 Unspecified viral hepatitis C without hepatic coma: Secondary | ICD-10-CM | POA: Diagnosis not present

## 2020-03-27 DIAGNOSIS — R748 Abnormal levels of other serum enzymes: Secondary | ICD-10-CM | POA: Diagnosis not present

## 2020-03-27 DIAGNOSIS — D84821 Immunodeficiency due to drugs: Secondary | ICD-10-CM | POA: Diagnosis not present

## 2020-03-27 DIAGNOSIS — E871 Hypo-osmolality and hyponatremia: Secondary | ICD-10-CM | POA: Diagnosis not present

## 2020-04-02 DIAGNOSIS — Z949 Transplanted organ and tissue status, unspecified: Secondary | ICD-10-CM | POA: Diagnosis not present

## 2020-04-03 DIAGNOSIS — M25559 Pain in unspecified hip: Secondary | ICD-10-CM | POA: Diagnosis not present

## 2020-04-03 DIAGNOSIS — M8588 Other specified disorders of bone density and structure, other site: Secondary | ICD-10-CM | POA: Diagnosis not present

## 2020-04-03 DIAGNOSIS — R1031 Right lower quadrant pain: Secondary | ICD-10-CM | POA: Diagnosis not present

## 2020-04-03 DIAGNOSIS — B1920 Unspecified viral hepatitis C without hepatic coma: Secondary | ICD-10-CM | POA: Diagnosis not present

## 2020-04-03 DIAGNOSIS — Z940 Kidney transplant status: Secondary | ICD-10-CM | POA: Diagnosis not present

## 2020-04-03 DIAGNOSIS — I959 Hypotension, unspecified: Secondary | ICD-10-CM | POA: Diagnosis not present

## 2020-04-03 DIAGNOSIS — L02415 Cutaneous abscess of right lower limb: Secondary | ICD-10-CM | POA: Diagnosis not present

## 2020-04-03 DIAGNOSIS — R739 Hyperglycemia, unspecified: Secondary | ICD-10-CM | POA: Diagnosis not present

## 2020-04-03 DIAGNOSIS — E871 Hypo-osmolality and hyponatremia: Secondary | ICD-10-CM | POA: Diagnosis not present

## 2020-04-03 DIAGNOSIS — T8389XA Other specified complication of genitourinary prosthetic devices, implants and grafts, initial encounter: Secondary | ICD-10-CM | POA: Diagnosis not present

## 2020-04-03 DIAGNOSIS — Z944 Liver transplant status: Secondary | ICD-10-CM | POA: Diagnosis not present

## 2020-04-03 DIAGNOSIS — M1611 Unilateral primary osteoarthritis, right hip: Secondary | ICD-10-CM | POA: Diagnosis not present

## 2020-04-03 DIAGNOSIS — D72829 Elevated white blood cell count, unspecified: Secondary | ICD-10-CM | POA: Diagnosis not present

## 2020-04-03 DIAGNOSIS — J90 Pleural effusion, not elsewhere classified: Secondary | ICD-10-CM | POA: Diagnosis not present

## 2020-04-03 DIAGNOSIS — T8643 Liver transplant infection: Secondary | ICD-10-CM | POA: Diagnosis not present

## 2020-04-03 DIAGNOSIS — R600 Localized edema: Secondary | ICD-10-CM | POA: Diagnosis not present

## 2020-04-03 DIAGNOSIS — I313 Pericardial effusion (noninflammatory): Secondary | ICD-10-CM | POA: Diagnosis not present

## 2020-04-03 DIAGNOSIS — N2889 Other specified disorders of kidney and ureter: Secondary | ICD-10-CM | POA: Diagnosis not present

## 2020-04-03 DIAGNOSIS — M25551 Pain in right hip: Secondary | ICD-10-CM | POA: Diagnosis not present

## 2020-04-03 DIAGNOSIS — E039 Hypothyroidism, unspecified: Secondary | ICD-10-CM | POA: Diagnosis not present

## 2020-04-03 DIAGNOSIS — Z949 Transplanted organ and tissue status, unspecified: Secondary | ICD-10-CM | POA: Diagnosis not present

## 2020-04-03 DIAGNOSIS — R1030 Lower abdominal pain, unspecified: Secondary | ICD-10-CM | POA: Diagnosis not present

## 2020-04-03 DIAGNOSIS — T380X5A Adverse effect of glucocorticoids and synthetic analogues, initial encounter: Secondary | ICD-10-CM | POA: Diagnosis not present

## 2020-04-03 DIAGNOSIS — R188 Other ascites: Secondary | ICD-10-CM | POA: Diagnosis not present

## 2020-04-03 DIAGNOSIS — M7918 Myalgia, other site: Secondary | ICD-10-CM | POA: Diagnosis not present

## 2020-04-03 DIAGNOSIS — T8619 Other complication of kidney transplant: Secondary | ICD-10-CM | POA: Diagnosis not present

## 2020-04-03 DIAGNOSIS — R601 Generalized edema: Secondary | ICD-10-CM | POA: Diagnosis not present

## 2020-04-03 DIAGNOSIS — K529 Noninfective gastroenteritis and colitis, unspecified: Secondary | ICD-10-CM | POA: Diagnosis not present

## 2020-04-03 DIAGNOSIS — Z119 Encounter for screening for infectious and parasitic diseases, unspecified: Secondary | ICD-10-CM | POA: Diagnosis not present

## 2020-04-03 DIAGNOSIS — R5381 Other malaise: Secondary | ICD-10-CM | POA: Diagnosis not present

## 2020-04-03 DIAGNOSIS — D849 Immunodeficiency, unspecified: Secondary | ICD-10-CM | POA: Diagnosis not present

## 2020-04-03 DIAGNOSIS — K567 Ileus, unspecified: Secondary | ICD-10-CM | POA: Diagnosis not present

## 2020-04-03 DIAGNOSIS — D84821 Immunodeficiency due to drugs: Secondary | ICD-10-CM | POA: Diagnosis not present

## 2020-04-03 DIAGNOSIS — M545 Low back pain: Secondary | ICD-10-CM | POA: Diagnosis not present

## 2020-04-03 DIAGNOSIS — Z792 Long term (current) use of antibiotics: Secondary | ICD-10-CM | POA: Diagnosis not present

## 2020-04-03 DIAGNOSIS — M79604 Pain in right leg: Secondary | ICD-10-CM | POA: Diagnosis not present

## 2020-04-05 DIAGNOSIS — Z949 Transplanted organ and tissue status, unspecified: Secondary | ICD-10-CM | POA: Diagnosis not present

## 2020-04-12 DIAGNOSIS — D849 Immunodeficiency, unspecified: Secondary | ICD-10-CM | POA: Diagnosis not present

## 2020-04-12 DIAGNOSIS — Z944 Liver transplant status: Secondary | ICD-10-CM | POA: Diagnosis not present

## 2020-04-12 DIAGNOSIS — Z940 Kidney transplant status: Secondary | ICD-10-CM | POA: Diagnosis not present

## 2020-04-14 DIAGNOSIS — R945 Abnormal results of liver function studies: Secondary | ICD-10-CM | POA: Diagnosis not present

## 2020-04-14 DIAGNOSIS — E039 Hypothyroidism, unspecified: Secondary | ICD-10-CM | POA: Diagnosis not present

## 2020-04-14 DIAGNOSIS — K7460 Unspecified cirrhosis of liver: Secondary | ICD-10-CM | POA: Diagnosis not present

## 2020-04-14 DIAGNOSIS — I129 Hypertensive chronic kidney disease with stage 1 through stage 4 chronic kidney disease, or unspecified chronic kidney disease: Secondary | ICD-10-CM | POA: Diagnosis not present

## 2020-04-14 DIAGNOSIS — Z20822 Contact with and (suspected) exposure to covid-19: Secondary | ICD-10-CM | POA: Diagnosis not present

## 2020-04-14 DIAGNOSIS — D849 Immunodeficiency, unspecified: Secondary | ICD-10-CM | POA: Diagnosis not present

## 2020-04-14 DIAGNOSIS — Z944 Liver transplant status: Secondary | ICD-10-CM | POA: Diagnosis not present

## 2020-04-14 DIAGNOSIS — J90 Pleural effusion, not elsewhere classified: Secondary | ICD-10-CM | POA: Diagnosis not present

## 2020-04-14 DIAGNOSIS — K7581 Nonalcoholic steatohepatitis (NASH): Secondary | ICD-10-CM | POA: Diagnosis not present

## 2020-04-14 DIAGNOSIS — N184 Chronic kidney disease, stage 4 (severe): Secondary | ICD-10-CM | POA: Diagnosis not present

## 2020-04-14 DIAGNOSIS — E1122 Type 2 diabetes mellitus with diabetic chronic kidney disease: Secondary | ICD-10-CM | POA: Diagnosis not present

## 2020-04-14 DIAGNOSIS — R7401 Elevation of levels of liver transaminase levels: Secondary | ICD-10-CM | POA: Diagnosis not present

## 2020-04-14 DIAGNOSIS — N179 Acute kidney failure, unspecified: Secondary | ICD-10-CM | POA: Diagnosis not present

## 2020-04-14 DIAGNOSIS — I313 Pericardial effusion (noninflammatory): Secondary | ICD-10-CM | POA: Diagnosis not present

## 2020-04-15 DIAGNOSIS — D849 Immunodeficiency, unspecified: Secondary | ICD-10-CM | POA: Diagnosis not present

## 2020-04-15 DIAGNOSIS — T8643 Liver transplant infection: Secondary | ICD-10-CM | POA: Diagnosis not present

## 2020-04-15 DIAGNOSIS — Z944 Liver transplant status: Secondary | ICD-10-CM | POA: Diagnosis not present

## 2020-04-15 DIAGNOSIS — B1920 Unspecified viral hepatitis C without hepatic coma: Secondary | ICD-10-CM | POA: Diagnosis not present

## 2020-04-15 DIAGNOSIS — R748 Abnormal levels of other serum enzymes: Secondary | ICD-10-CM | POA: Diagnosis not present

## 2020-04-17 DIAGNOSIS — Z940 Kidney transplant status: Secondary | ICD-10-CM | POA: Diagnosis not present

## 2020-04-17 DIAGNOSIS — D849 Immunodeficiency, unspecified: Secondary | ICD-10-CM | POA: Diagnosis not present

## 2020-04-17 DIAGNOSIS — Z944 Liver transplant status: Secondary | ICD-10-CM | POA: Diagnosis not present

## 2020-04-18 DIAGNOSIS — I129 Hypertensive chronic kidney disease with stage 1 through stage 4 chronic kidney disease, or unspecified chronic kidney disease: Secondary | ICD-10-CM | POA: Diagnosis not present

## 2020-04-18 DIAGNOSIS — N185 Chronic kidney disease, stage 5: Secondary | ICD-10-CM | POA: Diagnosis not present

## 2020-04-18 DIAGNOSIS — E039 Hypothyroidism, unspecified: Secondary | ICD-10-CM | POA: Diagnosis not present

## 2020-04-21 DIAGNOSIS — D849 Immunodeficiency, unspecified: Secondary | ICD-10-CM | POA: Diagnosis not present

## 2020-04-21 DIAGNOSIS — T8389XA Other specified complication of genitourinary prosthetic devices, implants and grafts, initial encounter: Secondary | ICD-10-CM | POA: Diagnosis not present

## 2020-04-21 DIAGNOSIS — T8643 Liver transplant infection: Secondary | ICD-10-CM | POA: Diagnosis not present

## 2020-04-21 DIAGNOSIS — Z944 Liver transplant status: Secondary | ICD-10-CM | POA: Diagnosis not present

## 2020-04-21 DIAGNOSIS — E8770 Fluid overload, unspecified: Secondary | ICD-10-CM | POA: Diagnosis not present

## 2020-04-21 DIAGNOSIS — Z940 Kidney transplant status: Secondary | ICD-10-CM | POA: Diagnosis not present

## 2020-04-21 DIAGNOSIS — T8619 Other complication of kidney transplant: Secondary | ICD-10-CM | POA: Diagnosis not present

## 2020-04-21 DIAGNOSIS — D84821 Immunodeficiency due to drugs: Secondary | ICD-10-CM | POA: Diagnosis not present

## 2020-04-21 DIAGNOSIS — E638 Other specified nutritional deficiencies: Secondary | ICD-10-CM | POA: Diagnosis not present

## 2020-04-21 DIAGNOSIS — B1920 Unspecified viral hepatitis C without hepatic coma: Secondary | ICD-10-CM | POA: Diagnosis not present

## 2020-04-21 DIAGNOSIS — R7989 Other specified abnormal findings of blood chemistry: Secondary | ICD-10-CM | POA: Diagnosis not present

## 2020-04-21 DIAGNOSIS — I129 Hypertensive chronic kidney disease with stage 1 through stage 4 chronic kidney disease, or unspecified chronic kidney disease: Secondary | ICD-10-CM | POA: Diagnosis not present

## 2020-04-24 DIAGNOSIS — Z940 Kidney transplant status: Secondary | ICD-10-CM | POA: Diagnosis not present

## 2020-04-24 DIAGNOSIS — D849 Immunodeficiency, unspecified: Secondary | ICD-10-CM | POA: Diagnosis not present

## 2020-04-24 DIAGNOSIS — Z944 Liver transplant status: Secondary | ICD-10-CM | POA: Diagnosis not present

## 2020-04-24 DIAGNOSIS — Z1159 Encounter for screening for other viral diseases: Secondary | ICD-10-CM | POA: Diagnosis not present

## 2020-04-24 DIAGNOSIS — Z114 Encounter for screening for human immunodeficiency virus [HIV]: Secondary | ICD-10-CM | POA: Diagnosis not present

## 2020-04-29 DIAGNOSIS — D849 Immunodeficiency, unspecified: Secondary | ICD-10-CM | POA: Diagnosis not present

## 2020-04-29 DIAGNOSIS — Z940 Kidney transplant status: Secondary | ICD-10-CM | POA: Diagnosis not present

## 2020-04-29 DIAGNOSIS — Z944 Liver transplant status: Secondary | ICD-10-CM | POA: Diagnosis not present

## 2020-05-05 DIAGNOSIS — R600 Localized edema: Secondary | ICD-10-CM | POA: Diagnosis not present

## 2020-05-05 DIAGNOSIS — E039 Hypothyroidism, unspecified: Secondary | ICD-10-CM | POA: Diagnosis not present

## 2020-05-05 DIAGNOSIS — D849 Immunodeficiency, unspecified: Secondary | ICD-10-CM | POA: Diagnosis not present

## 2020-05-05 DIAGNOSIS — Z940 Kidney transplant status: Secondary | ICD-10-CM | POA: Diagnosis not present

## 2020-05-05 DIAGNOSIS — E8770 Fluid overload, unspecified: Secondary | ICD-10-CM | POA: Diagnosis not present

## 2020-05-05 DIAGNOSIS — E119 Type 2 diabetes mellitus without complications: Secondary | ICD-10-CM | POA: Diagnosis not present

## 2020-05-05 DIAGNOSIS — Z949 Transplanted organ and tissue status, unspecified: Secondary | ICD-10-CM | POA: Diagnosis not present

## 2020-05-05 DIAGNOSIS — I10 Essential (primary) hypertension: Secondary | ICD-10-CM | POA: Diagnosis not present

## 2020-05-05 DIAGNOSIS — D84821 Immunodeficiency due to drugs: Secondary | ICD-10-CM | POA: Diagnosis not present

## 2020-05-05 DIAGNOSIS — Z944 Liver transplant status: Secondary | ICD-10-CM | POA: Diagnosis not present

## 2020-05-05 DIAGNOSIS — T8619 Other complication of kidney transplant: Secondary | ICD-10-CM | POA: Diagnosis not present

## 2020-05-05 DIAGNOSIS — Z48288 Encounter for aftercare following multiple organ transplant: Secondary | ICD-10-CM | POA: Diagnosis not present

## 2020-05-05 DIAGNOSIS — M549 Dorsalgia, unspecified: Secondary | ICD-10-CM | POA: Diagnosis not present

## 2020-05-05 DIAGNOSIS — T8389XA Other specified complication of genitourinary prosthetic devices, implants and grafts, initial encounter: Secondary | ICD-10-CM | POA: Diagnosis not present

## 2020-05-09 DIAGNOSIS — Z940 Kidney transplant status: Secondary | ICD-10-CM | POA: Diagnosis not present

## 2020-05-19 DIAGNOSIS — T380X5A Adverse effect of glucocorticoids and synthetic analogues, initial encounter: Secondary | ICD-10-CM | POA: Diagnosis not present

## 2020-05-19 DIAGNOSIS — B1920 Unspecified viral hepatitis C without hepatic coma: Secondary | ICD-10-CM | POA: Diagnosis not present

## 2020-05-19 DIAGNOSIS — I129 Hypertensive chronic kidney disease with stage 1 through stage 4 chronic kidney disease, or unspecified chronic kidney disease: Secondary | ICD-10-CM | POA: Diagnosis not present

## 2020-05-19 DIAGNOSIS — T8643 Liver transplant infection: Secondary | ICD-10-CM | POA: Diagnosis not present

## 2020-05-19 DIAGNOSIS — T8619 Other complication of kidney transplant: Secondary | ICD-10-CM | POA: Diagnosis not present

## 2020-05-19 DIAGNOSIS — E039 Hypothyroidism, unspecified: Secondary | ICD-10-CM | POA: Diagnosis not present

## 2020-05-19 DIAGNOSIS — T8389XA Other specified complication of genitourinary prosthetic devices, implants and grafts, initial encounter: Secondary | ICD-10-CM | POA: Diagnosis not present

## 2020-05-19 DIAGNOSIS — Z949 Transplanted organ and tissue status, unspecified: Secondary | ICD-10-CM | POA: Diagnosis not present

## 2020-05-19 DIAGNOSIS — D849 Immunodeficiency, unspecified: Secondary | ICD-10-CM | POA: Diagnosis not present

## 2020-05-19 DIAGNOSIS — Z4823 Encounter for aftercare following liver transplant: Secondary | ICD-10-CM | POA: Diagnosis not present

## 2020-06-02 DIAGNOSIS — B1920 Unspecified viral hepatitis C without hepatic coma: Secondary | ICD-10-CM | POA: Diagnosis not present

## 2020-06-02 DIAGNOSIS — D849 Immunodeficiency, unspecified: Secondary | ICD-10-CM | POA: Diagnosis not present

## 2020-06-02 DIAGNOSIS — T8643 Liver transplant infection: Secondary | ICD-10-CM | POA: Diagnosis not present

## 2020-06-02 DIAGNOSIS — Z79899 Other long term (current) drug therapy: Secondary | ICD-10-CM | POA: Diagnosis not present

## 2020-06-02 DIAGNOSIS — Z940 Kidney transplant status: Secondary | ICD-10-CM | POA: Diagnosis not present

## 2020-06-02 DIAGNOSIS — Z949 Transplanted organ and tissue status, unspecified: Secondary | ICD-10-CM | POA: Diagnosis not present

## 2020-06-02 DIAGNOSIS — Z87891 Personal history of nicotine dependence: Secondary | ICD-10-CM | POA: Diagnosis not present

## 2020-06-02 DIAGNOSIS — Z7952 Long term (current) use of systemic steroids: Secondary | ICD-10-CM | POA: Diagnosis not present

## 2020-06-02 DIAGNOSIS — Z944 Liver transplant status: Secondary | ICD-10-CM | POA: Diagnosis not present

## 2020-06-02 DIAGNOSIS — Z48288 Encounter for aftercare following multiple organ transplant: Secondary | ICD-10-CM | POA: Diagnosis not present

## 2020-06-02 DIAGNOSIS — C220 Liver cell carcinoma: Secondary | ICD-10-CM | POA: Diagnosis not present

## 2020-06-02 DIAGNOSIS — D84821 Immunodeficiency due to drugs: Secondary | ICD-10-CM | POA: Diagnosis not present

## 2020-06-06 DIAGNOSIS — Z940 Kidney transplant status: Secondary | ICD-10-CM | POA: Diagnosis not present

## 2020-06-06 DIAGNOSIS — Z7952 Long term (current) use of systemic steroids: Secondary | ICD-10-CM | POA: Diagnosis not present

## 2020-06-06 DIAGNOSIS — Z794 Long term (current) use of insulin: Secondary | ICD-10-CM | POA: Diagnosis not present

## 2020-06-06 DIAGNOSIS — E1165 Type 2 diabetes mellitus with hyperglycemia: Secondary | ICD-10-CM | POA: Diagnosis not present

## 2020-06-06 DIAGNOSIS — Z87891 Personal history of nicotine dependence: Secondary | ICD-10-CM | POA: Diagnosis not present

## 2020-06-06 DIAGNOSIS — Z949 Transplanted organ and tissue status, unspecified: Secondary | ICD-10-CM | POA: Diagnosis not present

## 2020-06-06 DIAGNOSIS — Z944 Liver transplant status: Secondary | ICD-10-CM | POA: Diagnosis not present

## 2020-06-06 DIAGNOSIS — T380X5A Adverse effect of glucocorticoids and synthetic analogues, initial encounter: Secondary | ICD-10-CM | POA: Diagnosis not present

## 2020-06-09 DIAGNOSIS — Z79899 Other long term (current) drug therapy: Secondary | ICD-10-CM | POA: Diagnosis not present

## 2020-06-09 DIAGNOSIS — Z298 Encounter for other specified prophylactic measures: Secondary | ICD-10-CM | POA: Diagnosis not present

## 2020-06-09 DIAGNOSIS — Z949 Transplanted organ and tissue status, unspecified: Secondary | ICD-10-CM | POA: Diagnosis not present

## 2020-06-09 DIAGNOSIS — Z944 Liver transplant status: Secondary | ICD-10-CM | POA: Diagnosis not present

## 2020-06-09 DIAGNOSIS — Z940 Kidney transplant status: Secondary | ICD-10-CM | POA: Diagnosis not present

## 2020-06-18 DIAGNOSIS — D849 Immunodeficiency, unspecified: Secondary | ICD-10-CM | POA: Diagnosis not present

## 2020-06-18 DIAGNOSIS — Z944 Liver transplant status: Secondary | ICD-10-CM | POA: Diagnosis not present

## 2020-06-24 DIAGNOSIS — Z7189 Other specified counseling: Secondary | ICD-10-CM | POA: Diagnosis not present

## 2020-06-24 DIAGNOSIS — Z1339 Encounter for screening examination for other mental health and behavioral disorders: Secondary | ICD-10-CM | POA: Diagnosis not present

## 2020-06-24 DIAGNOSIS — E039 Hypothyroidism, unspecified: Secondary | ICD-10-CM | POA: Diagnosis not present

## 2020-06-24 DIAGNOSIS — R5383 Other fatigue: Secondary | ICD-10-CM | POA: Diagnosis not present

## 2020-06-24 DIAGNOSIS — E1165 Type 2 diabetes mellitus with hyperglycemia: Secondary | ICD-10-CM | POA: Diagnosis not present

## 2020-06-24 DIAGNOSIS — Z1331 Encounter for screening for depression: Secondary | ICD-10-CM | POA: Diagnosis not present

## 2020-06-24 DIAGNOSIS — E119 Type 2 diabetes mellitus without complications: Secondary | ICD-10-CM | POA: Diagnosis not present

## 2020-06-24 DIAGNOSIS — Z0000 Encounter for general adult medical examination without abnormal findings: Secondary | ICD-10-CM | POA: Diagnosis not present

## 2020-06-24 DIAGNOSIS — Z299 Encounter for prophylactic measures, unspecified: Secondary | ICD-10-CM | POA: Diagnosis not present

## 2020-07-02 DIAGNOSIS — D849 Immunodeficiency, unspecified: Secondary | ICD-10-CM | POA: Diagnosis not present

## 2020-07-02 DIAGNOSIS — Z944 Liver transplant status: Secondary | ICD-10-CM | POA: Diagnosis not present

## 2020-07-02 DIAGNOSIS — B998 Other infectious disease: Secondary | ICD-10-CM | POA: Diagnosis not present

## 2020-07-08 DIAGNOSIS — Z940 Kidney transplant status: Secondary | ICD-10-CM | POA: Diagnosis not present

## 2020-07-08 DIAGNOSIS — N2889 Other specified disorders of kidney and ureter: Secondary | ICD-10-CM | POA: Diagnosis not present

## 2020-07-10 DIAGNOSIS — B998 Other infectious disease: Secondary | ICD-10-CM | POA: Diagnosis not present

## 2020-07-10 DIAGNOSIS — Z944 Liver transplant status: Secondary | ICD-10-CM | POA: Diagnosis not present

## 2020-07-10 DIAGNOSIS — D849 Immunodeficiency, unspecified: Secondary | ICD-10-CM | POA: Diagnosis not present

## 2020-07-16 DIAGNOSIS — E1165 Type 2 diabetes mellitus with hyperglycemia: Secondary | ICD-10-CM | POA: Diagnosis not present

## 2020-07-16 DIAGNOSIS — Z79899 Other long term (current) drug therapy: Secondary | ICD-10-CM | POA: Diagnosis not present

## 2020-07-16 DIAGNOSIS — E1122 Type 2 diabetes mellitus with diabetic chronic kidney disease: Secondary | ICD-10-CM | POA: Diagnosis not present

## 2020-07-16 DIAGNOSIS — Z940 Kidney transplant status: Secondary | ICD-10-CM | POA: Diagnosis not present

## 2020-07-16 DIAGNOSIS — Z792 Long term (current) use of antibiotics: Secondary | ICD-10-CM | POA: Diagnosis not present

## 2020-07-16 DIAGNOSIS — T375X5A Adverse effect of antiviral drugs, initial encounter: Secondary | ICD-10-CM | POA: Diagnosis not present

## 2020-07-16 DIAGNOSIS — Z944 Liver transplant status: Secondary | ICD-10-CM | POA: Diagnosis not present

## 2020-07-16 DIAGNOSIS — D696 Thrombocytopenia, unspecified: Secondary | ICD-10-CM | POA: Diagnosis not present

## 2020-07-16 DIAGNOSIS — B1710 Acute hepatitis C without hepatic coma: Secondary | ICD-10-CM | POA: Diagnosis not present

## 2020-07-16 DIAGNOSIS — N179 Acute kidney failure, unspecified: Secondary | ICD-10-CM | POA: Diagnosis not present

## 2020-07-16 DIAGNOSIS — D849 Immunodeficiency, unspecified: Secondary | ICD-10-CM | POA: Diagnosis not present

## 2020-07-16 DIAGNOSIS — I10 Essential (primary) hypertension: Secondary | ICD-10-CM | POA: Diagnosis not present

## 2020-07-16 DIAGNOSIS — D84821 Immunodeficiency due to drugs: Secondary | ICD-10-CM | POA: Diagnosis not present

## 2020-07-16 DIAGNOSIS — T471X5A Adverse effect of other antacids and anti-gastric-secretion drugs, initial encounter: Secondary | ICD-10-CM | POA: Diagnosis not present

## 2020-07-16 DIAGNOSIS — D6959 Other secondary thrombocytopenia: Secondary | ICD-10-CM | POA: Diagnosis not present

## 2020-07-16 DIAGNOSIS — T8619 Other complication of kidney transplant: Secondary | ICD-10-CM | POA: Diagnosis not present

## 2020-07-16 DIAGNOSIS — E039 Hypothyroidism, unspecified: Secondary | ICD-10-CM | POA: Diagnosis not present

## 2020-07-16 DIAGNOSIS — Z949 Transplanted organ and tissue status, unspecified: Secondary | ICD-10-CM | POA: Diagnosis not present

## 2020-07-17 DIAGNOSIS — D696 Thrombocytopenia, unspecified: Secondary | ICD-10-CM | POA: Diagnosis not present

## 2020-07-17 DIAGNOSIS — Z940 Kidney transplant status: Secondary | ICD-10-CM | POA: Diagnosis not present

## 2020-07-17 DIAGNOSIS — E039 Hypothyroidism, unspecified: Secondary | ICD-10-CM | POA: Diagnosis not present

## 2020-07-17 DIAGNOSIS — Z79899 Other long term (current) drug therapy: Secondary | ICD-10-CM | POA: Diagnosis not present

## 2020-07-17 DIAGNOSIS — D849 Immunodeficiency, unspecified: Secondary | ICD-10-CM | POA: Diagnosis not present

## 2020-07-17 DIAGNOSIS — N179 Acute kidney failure, unspecified: Secondary | ICD-10-CM | POA: Diagnosis not present

## 2020-07-17 DIAGNOSIS — Z792 Long term (current) use of antibiotics: Secondary | ICD-10-CM | POA: Diagnosis not present

## 2020-07-17 DIAGNOSIS — T8619 Other complication of kidney transplant: Secondary | ICD-10-CM | POA: Diagnosis not present

## 2020-07-18 DIAGNOSIS — T8619 Other complication of kidney transplant: Secondary | ICD-10-CM | POA: Diagnosis not present

## 2020-07-18 DIAGNOSIS — B1710 Acute hepatitis C without hepatic coma: Secondary | ICD-10-CM | POA: Diagnosis not present

## 2020-07-18 DIAGNOSIS — E039 Hypothyroidism, unspecified: Secondary | ICD-10-CM | POA: Diagnosis not present

## 2020-07-18 DIAGNOSIS — Z792 Long term (current) use of antibiotics: Secondary | ICD-10-CM | POA: Diagnosis not present

## 2020-07-18 DIAGNOSIS — Z79899 Other long term (current) drug therapy: Secondary | ICD-10-CM | POA: Diagnosis not present

## 2020-07-18 DIAGNOSIS — D849 Immunodeficiency, unspecified: Secondary | ICD-10-CM | POA: Diagnosis not present

## 2020-07-18 DIAGNOSIS — I10 Essential (primary) hypertension: Secondary | ICD-10-CM | POA: Diagnosis not present

## 2020-07-18 DIAGNOSIS — Z940 Kidney transplant status: Secondary | ICD-10-CM | POA: Diagnosis not present

## 2020-07-18 DIAGNOSIS — E1165 Type 2 diabetes mellitus with hyperglycemia: Secondary | ICD-10-CM | POA: Diagnosis not present

## 2020-07-18 DIAGNOSIS — D696 Thrombocytopenia, unspecified: Secondary | ICD-10-CM | POA: Diagnosis not present

## 2020-07-18 DIAGNOSIS — Z944 Liver transplant status: Secondary | ICD-10-CM | POA: Diagnosis not present

## 2020-07-21 DIAGNOSIS — Z7952 Long term (current) use of systemic steroids: Secondary | ICD-10-CM | POA: Diagnosis not present

## 2020-07-21 DIAGNOSIS — D696 Thrombocytopenia, unspecified: Secondary | ICD-10-CM | POA: Diagnosis not present

## 2020-07-21 DIAGNOSIS — Z87891 Personal history of nicotine dependence: Secondary | ICD-10-CM | POA: Diagnosis not present

## 2020-07-21 DIAGNOSIS — C220 Liver cell carcinoma: Secondary | ICD-10-CM | POA: Diagnosis not present

## 2020-07-21 DIAGNOSIS — Z940 Kidney transplant status: Secondary | ICD-10-CM | POA: Diagnosis not present

## 2020-07-21 DIAGNOSIS — D84821 Immunodeficiency due to drugs: Secondary | ICD-10-CM | POA: Diagnosis not present

## 2020-07-21 DIAGNOSIS — Z949 Transplanted organ and tissue status, unspecified: Secondary | ICD-10-CM | POA: Diagnosis not present

## 2020-07-21 DIAGNOSIS — T8649 Other complications of liver transplant: Secondary | ICD-10-CM | POA: Diagnosis not present

## 2020-07-21 DIAGNOSIS — Z944 Liver transplant status: Secondary | ICD-10-CM | POA: Diagnosis not present

## 2020-07-21 DIAGNOSIS — Z298 Encounter for other specified prophylactic measures: Secondary | ICD-10-CM | POA: Diagnosis not present

## 2020-07-21 DIAGNOSIS — Z23 Encounter for immunization: Secondary | ICD-10-CM | POA: Diagnosis not present

## 2020-07-21 DIAGNOSIS — T8643 Liver transplant infection: Secondary | ICD-10-CM | POA: Diagnosis not present

## 2020-07-21 DIAGNOSIS — B1920 Unspecified viral hepatitis C without hepatic coma: Secondary | ICD-10-CM | POA: Diagnosis not present

## 2020-07-21 DIAGNOSIS — E119 Type 2 diabetes mellitus without complications: Secondary | ICD-10-CM | POA: Diagnosis not present

## 2020-07-21 DIAGNOSIS — Z4822 Encounter for aftercare following kidney transplant: Secondary | ICD-10-CM | POA: Diagnosis not present

## 2020-07-21 DIAGNOSIS — D849 Immunodeficiency, unspecified: Secondary | ICD-10-CM | POA: Diagnosis not present

## 2020-07-21 DIAGNOSIS — I10 Essential (primary) hypertension: Secondary | ICD-10-CM | POA: Diagnosis not present

## 2020-07-29 DIAGNOSIS — L918 Other hypertrophic disorders of the skin: Secondary | ICD-10-CM | POA: Diagnosis not present

## 2020-07-29 DIAGNOSIS — L570 Actinic keratosis: Secondary | ICD-10-CM | POA: Diagnosis not present

## 2020-07-29 DIAGNOSIS — D2260 Melanocytic nevi of unspecified upper limb, including shoulder: Secondary | ICD-10-CM | POA: Diagnosis not present

## 2020-07-29 DIAGNOSIS — L821 Other seborrheic keratosis: Secondary | ICD-10-CM | POA: Diagnosis not present

## 2020-07-29 DIAGNOSIS — Z949 Transplanted organ and tissue status, unspecified: Secondary | ICD-10-CM | POA: Diagnosis not present

## 2020-07-29 DIAGNOSIS — B351 Tinea unguium: Secondary | ICD-10-CM | POA: Diagnosis not present

## 2020-07-29 DIAGNOSIS — L304 Erythema intertrigo: Secondary | ICD-10-CM | POA: Diagnosis not present

## 2020-07-29 DIAGNOSIS — L814 Other melanin hyperpigmentation: Secondary | ICD-10-CM | POA: Diagnosis not present

## 2020-07-29 DIAGNOSIS — L565 Disseminated superficial actinic porokeratosis (DSAP): Secondary | ICD-10-CM | POA: Diagnosis not present

## 2020-07-29 DIAGNOSIS — D1801 Hemangioma of skin and subcutaneous tissue: Secondary | ICD-10-CM | POA: Diagnosis not present

## 2020-07-30 DIAGNOSIS — D849 Immunodeficiency, unspecified: Secondary | ICD-10-CM | POA: Diagnosis not present

## 2020-07-30 DIAGNOSIS — Z944 Liver transplant status: Secondary | ICD-10-CM | POA: Diagnosis not present

## 2020-07-30 DIAGNOSIS — B998 Other infectious disease: Secondary | ICD-10-CM | POA: Diagnosis not present

## 2020-08-06 DIAGNOSIS — D849 Immunodeficiency, unspecified: Secondary | ICD-10-CM | POA: Diagnosis not present

## 2020-08-06 DIAGNOSIS — B998 Other infectious disease: Secondary | ICD-10-CM | POA: Diagnosis not present

## 2020-08-06 DIAGNOSIS — Z944 Liver transplant status: Secondary | ICD-10-CM | POA: Diagnosis not present

## 2020-08-06 DIAGNOSIS — Z940 Kidney transplant status: Secondary | ICD-10-CM | POA: Diagnosis not present

## 2020-08-13 DIAGNOSIS — B998 Other infectious disease: Secondary | ICD-10-CM | POA: Diagnosis not present

## 2020-08-13 DIAGNOSIS — Z944 Liver transplant status: Secondary | ICD-10-CM | POA: Diagnosis not present

## 2020-08-13 DIAGNOSIS — D849 Immunodeficiency, unspecified: Secondary | ICD-10-CM | POA: Diagnosis not present

## 2020-08-18 DIAGNOSIS — E1165 Type 2 diabetes mellitus with hyperglycemia: Secondary | ICD-10-CM | POA: Diagnosis not present

## 2020-08-18 DIAGNOSIS — C220 Liver cell carcinoma: Secondary | ICD-10-CM | POA: Diagnosis not present

## 2020-08-18 DIAGNOSIS — Z794 Long term (current) use of insulin: Secondary | ICD-10-CM | POA: Diagnosis not present

## 2020-08-18 DIAGNOSIS — D84821 Immunodeficiency due to drugs: Secondary | ICD-10-CM | POA: Diagnosis not present

## 2020-08-18 DIAGNOSIS — Z949 Transplanted organ and tissue status, unspecified: Secondary | ICD-10-CM | POA: Diagnosis not present

## 2020-08-18 DIAGNOSIS — B1920 Unspecified viral hepatitis C without hepatic coma: Secondary | ICD-10-CM | POA: Diagnosis not present

## 2020-08-18 DIAGNOSIS — E119 Type 2 diabetes mellitus without complications: Secondary | ICD-10-CM | POA: Diagnosis not present

## 2020-08-18 DIAGNOSIS — Z87891 Personal history of nicotine dependence: Secondary | ICD-10-CM | POA: Diagnosis not present

## 2020-08-18 DIAGNOSIS — Z23 Encounter for immunization: Secondary | ICD-10-CM | POA: Diagnosis not present

## 2020-08-18 DIAGNOSIS — D849 Immunodeficiency, unspecified: Secondary | ICD-10-CM | POA: Diagnosis not present

## 2020-08-18 DIAGNOSIS — T8643 Liver transplant infection: Secondary | ICD-10-CM | POA: Diagnosis not present

## 2020-08-18 DIAGNOSIS — Z944 Liver transplant status: Secondary | ICD-10-CM | POA: Diagnosis not present

## 2020-08-20 DIAGNOSIS — Z940 Kidney transplant status: Secondary | ICD-10-CM | POA: Diagnosis not present

## 2020-08-20 DIAGNOSIS — D849 Immunodeficiency, unspecified: Secondary | ICD-10-CM | POA: Diagnosis not present

## 2020-08-20 DIAGNOSIS — Z944 Liver transplant status: Secondary | ICD-10-CM | POA: Diagnosis not present

## 2020-08-20 DIAGNOSIS — B998 Other infectious disease: Secondary | ICD-10-CM | POA: Diagnosis not present

## 2020-08-24 IMAGING — CR DG CHEST 1V PORT
1 series · 1 of 1 positions shown · non-contrast
Comparison: 10/03/2017

CLINICAL DATA: Weakness

EXAM:
PORTABLE CHEST 1 VIEW

[portable]
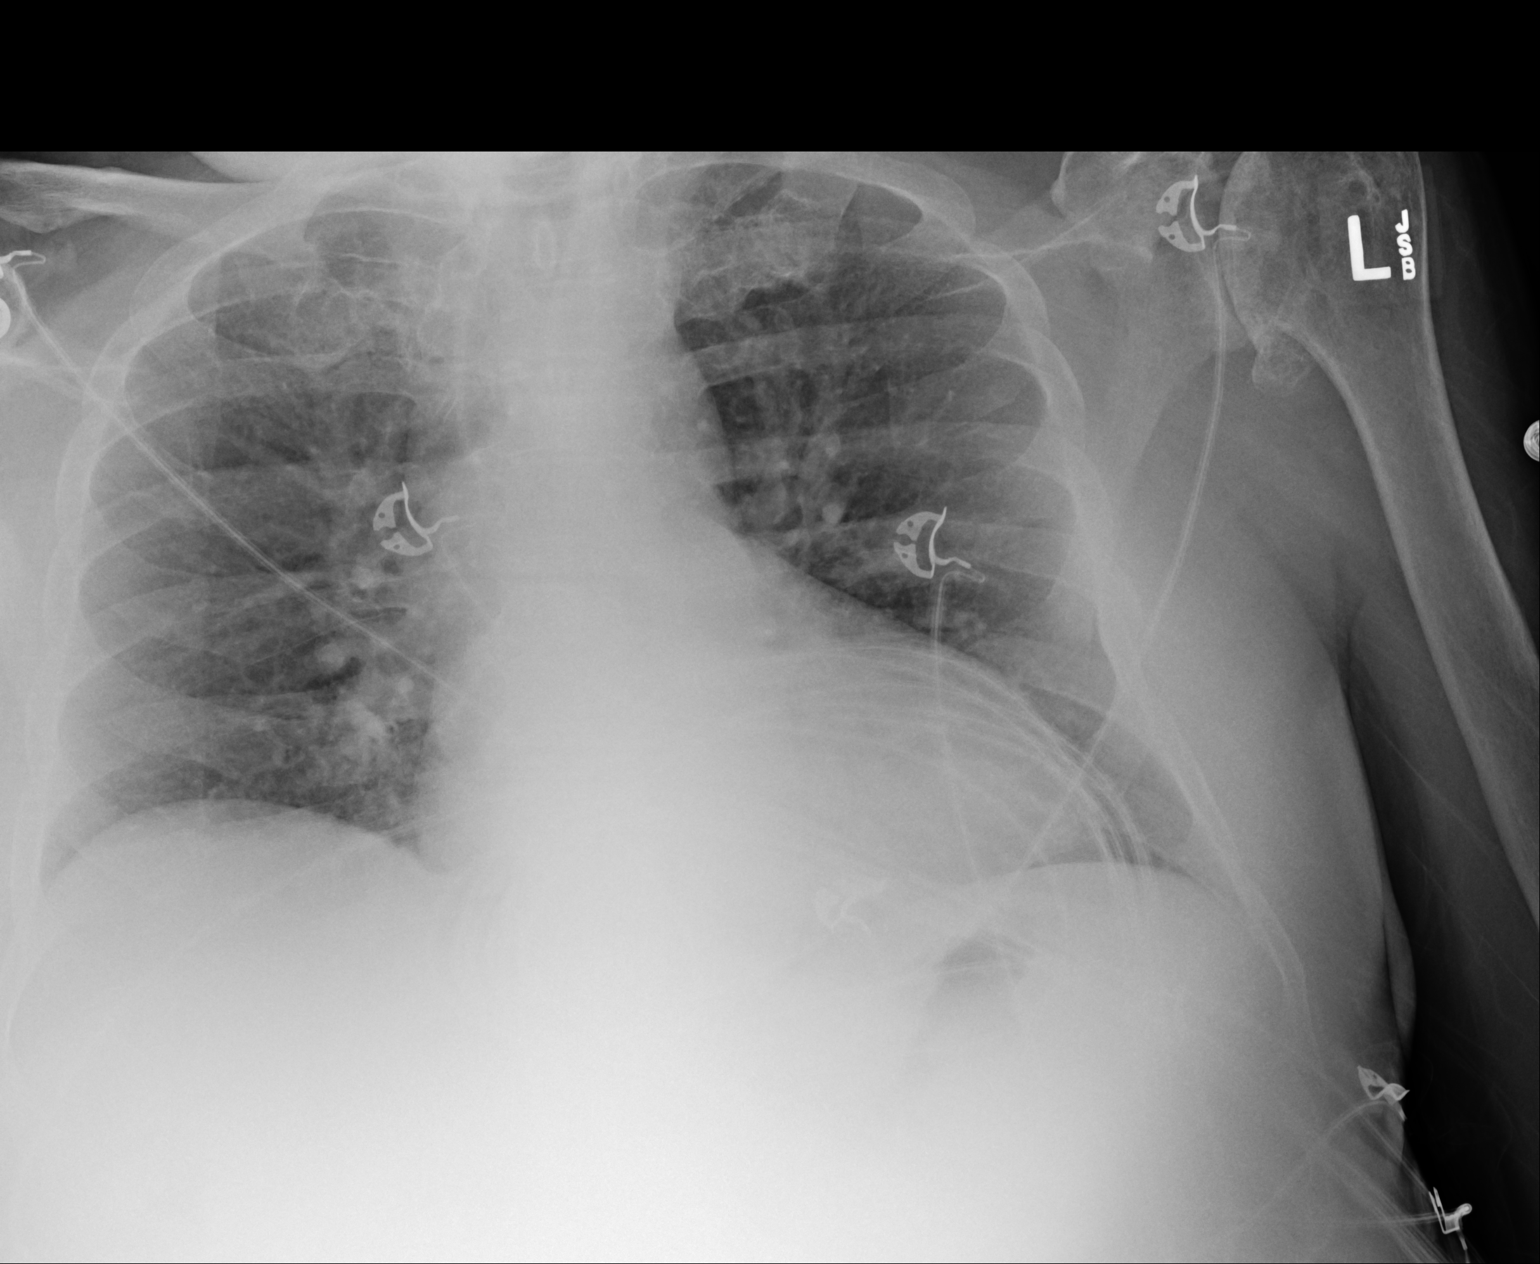

[1 of 1 positions shown; findings below may reference images not displayed]

FINDINGS: Cardiac shadow is at the upper limits of normal but accentuated by
the portable technique. The lungs are hypoinflated but clear.
Degenerative changes of the right shoulder joint are noted. No acute
bony abnormality is seen.
IMPRESSION: No acute abnormality noted.

## 2020-08-27 DIAGNOSIS — T8643 Liver transplant infection: Secondary | ICD-10-CM | POA: Diagnosis not present

## 2020-09-10 DIAGNOSIS — Z944 Liver transplant status: Secondary | ICD-10-CM | POA: Diagnosis not present

## 2020-09-10 DIAGNOSIS — D849 Immunodeficiency, unspecified: Secondary | ICD-10-CM | POA: Diagnosis not present

## 2020-09-10 DIAGNOSIS — Z940 Kidney transplant status: Secondary | ICD-10-CM | POA: Diagnosis not present

## 2020-09-17 DIAGNOSIS — T8619 Other complication of kidney transplant: Secondary | ICD-10-CM | POA: Diagnosis not present

## 2020-09-17 DIAGNOSIS — Z940 Kidney transplant status: Secondary | ICD-10-CM | POA: Diagnosis not present

## 2020-09-17 DIAGNOSIS — Z4822 Encounter for aftercare following kidney transplant: Secondary | ICD-10-CM | POA: Diagnosis not present

## 2020-09-17 DIAGNOSIS — Z7984 Long term (current) use of oral hypoglycemic drugs: Secondary | ICD-10-CM | POA: Diagnosis not present

## 2020-09-17 DIAGNOSIS — D702 Other drug-induced agranulocytosis: Secondary | ICD-10-CM | POA: Diagnosis not present

## 2020-09-17 DIAGNOSIS — Z4823 Encounter for aftercare following liver transplant: Secondary | ICD-10-CM | POA: Diagnosis not present

## 2020-09-17 DIAGNOSIS — Z949 Transplanted organ and tissue status, unspecified: Secondary | ICD-10-CM | POA: Diagnosis not present

## 2020-09-17 DIAGNOSIS — Z87891 Personal history of nicotine dependence: Secondary | ICD-10-CM | POA: Diagnosis not present

## 2020-09-17 DIAGNOSIS — Z79899 Other long term (current) drug therapy: Secondary | ICD-10-CM | POA: Diagnosis not present

## 2020-09-17 DIAGNOSIS — B1920 Unspecified viral hepatitis C without hepatic coma: Secondary | ICD-10-CM | POA: Diagnosis not present

## 2020-09-24 DIAGNOSIS — T8643 Liver transplant infection: Secondary | ICD-10-CM | POA: Diagnosis not present

## 2020-09-24 DIAGNOSIS — B998 Other infectious disease: Secondary | ICD-10-CM | POA: Diagnosis not present

## 2020-09-24 DIAGNOSIS — Z940 Kidney transplant status: Secondary | ICD-10-CM | POA: Diagnosis not present

## 2020-09-24 DIAGNOSIS — D849 Immunodeficiency, unspecified: Secondary | ICD-10-CM | POA: Diagnosis not present

## 2020-09-29 DIAGNOSIS — D849 Immunodeficiency, unspecified: Secondary | ICD-10-CM | POA: Diagnosis not present

## 2020-09-29 DIAGNOSIS — B998 Other infectious disease: Secondary | ICD-10-CM | POA: Diagnosis not present

## 2020-09-29 DIAGNOSIS — Z944 Liver transplant status: Secondary | ICD-10-CM | POA: Diagnosis not present

## 2020-10-15 DIAGNOSIS — Z87891 Personal history of nicotine dependence: Secondary | ICD-10-CM | POA: Diagnosis not present

## 2020-10-15 DIAGNOSIS — N281 Cyst of kidney, acquired: Secondary | ICD-10-CM | POA: Diagnosis not present

## 2020-10-15 DIAGNOSIS — Z944 Liver transplant status: Secondary | ICD-10-CM | POA: Diagnosis not present

## 2020-10-15 DIAGNOSIS — Z940 Kidney transplant status: Secondary | ICD-10-CM | POA: Diagnosis not present

## 2020-10-15 DIAGNOSIS — Z4823 Encounter for aftercare following liver transplant: Secondary | ICD-10-CM | POA: Diagnosis not present

## 2020-10-15 DIAGNOSIS — D84821 Immunodeficiency due to drugs: Secondary | ICD-10-CM | POA: Diagnosis not present

## 2020-10-15 DIAGNOSIS — C220 Liver cell carcinoma: Secondary | ICD-10-CM | POA: Diagnosis not present

## 2020-10-15 DIAGNOSIS — D849 Immunodeficiency, unspecified: Secondary | ICD-10-CM | POA: Diagnosis not present

## 2020-10-15 DIAGNOSIS — Z8505 Personal history of malignant neoplasm of liver: Secondary | ICD-10-CM | POA: Diagnosis not present

## 2020-10-15 DIAGNOSIS — K766 Portal hypertension: Secondary | ICD-10-CM | POA: Diagnosis not present

## 2020-10-15 DIAGNOSIS — R161 Splenomegaly, not elsewhere classified: Secondary | ICD-10-CM | POA: Diagnosis not present

## 2020-10-15 DIAGNOSIS — E875 Hyperkalemia: Secondary | ICD-10-CM | POA: Diagnosis not present

## 2020-10-20 DIAGNOSIS — Z940 Kidney transplant status: Secondary | ICD-10-CM | POA: Diagnosis not present

## 2020-10-20 DIAGNOSIS — D849 Immunodeficiency, unspecified: Secondary | ICD-10-CM | POA: Diagnosis not present

## 2020-10-20 DIAGNOSIS — Z944 Liver transplant status: Secondary | ICD-10-CM | POA: Diagnosis not present

## 2020-10-20 DIAGNOSIS — B998 Other infectious disease: Secondary | ICD-10-CM | POA: Diagnosis not present

## 2020-11-03 DIAGNOSIS — Z944 Liver transplant status: Secondary | ICD-10-CM | POA: Diagnosis not present

## 2020-11-03 DIAGNOSIS — D849 Immunodeficiency, unspecified: Secondary | ICD-10-CM | POA: Diagnosis not present

## 2020-11-03 DIAGNOSIS — Z940 Kidney transplant status: Secondary | ICD-10-CM | POA: Diagnosis not present

## 2020-11-06 DIAGNOSIS — Z0100 Encounter for examination of eyes and vision without abnormal findings: Secondary | ICD-10-CM | POA: Diagnosis not present

## 2020-11-06 DIAGNOSIS — H5210 Myopia, unspecified eye: Secondary | ICD-10-CM | POA: Diagnosis not present

## 2020-11-12 DIAGNOSIS — Z4822 Encounter for aftercare following kidney transplant: Secondary | ICD-10-CM | POA: Diagnosis not present

## 2020-11-12 DIAGNOSIS — B1920 Unspecified viral hepatitis C without hepatic coma: Secondary | ICD-10-CM | POA: Diagnosis not present

## 2020-11-12 DIAGNOSIS — D849 Immunodeficiency, unspecified: Secondary | ICD-10-CM | POA: Diagnosis not present

## 2020-11-12 DIAGNOSIS — Z4823 Encounter for aftercare following liver transplant: Secondary | ICD-10-CM | POA: Diagnosis not present

## 2020-11-12 DIAGNOSIS — C220 Liver cell carcinoma: Secondary | ICD-10-CM | POA: Diagnosis not present

## 2020-11-12 DIAGNOSIS — Z79899 Other long term (current) drug therapy: Secondary | ICD-10-CM | POA: Diagnosis not present

## 2020-11-12 DIAGNOSIS — T8643 Liver transplant infection: Secondary | ICD-10-CM | POA: Diagnosis not present

## 2020-11-12 DIAGNOSIS — Z87891 Personal history of nicotine dependence: Secondary | ICD-10-CM | POA: Diagnosis not present

## 2020-11-12 DIAGNOSIS — Z940 Kidney transplant status: Secondary | ICD-10-CM | POA: Diagnosis not present

## 2020-11-12 DIAGNOSIS — D84821 Immunodeficiency due to drugs: Secondary | ICD-10-CM | POA: Diagnosis not present

## 2020-11-12 DIAGNOSIS — Z7952 Long term (current) use of systemic steroids: Secondary | ICD-10-CM | POA: Diagnosis not present

## 2020-11-19 ENCOUNTER — Other Ambulatory Visit: Payer: Self-pay | Admitting: "Endocrinology

## 2020-12-09 DIAGNOSIS — D849 Immunodeficiency, unspecified: Secondary | ICD-10-CM | POA: Diagnosis not present

## 2020-12-09 DIAGNOSIS — Z944 Liver transplant status: Secondary | ICD-10-CM | POA: Diagnosis not present

## 2020-12-09 DIAGNOSIS — Z940 Kidney transplant status: Secondary | ICD-10-CM | POA: Diagnosis not present

## 2020-12-24 DIAGNOSIS — Z299 Encounter for prophylactic measures, unspecified: Secondary | ICD-10-CM | POA: Diagnosis not present

## 2020-12-24 DIAGNOSIS — Z944 Liver transplant status: Secondary | ICD-10-CM | POA: Diagnosis not present

## 2020-12-24 DIAGNOSIS — I10 Essential (primary) hypertension: Secondary | ICD-10-CM | POA: Diagnosis not present

## 2020-12-24 DIAGNOSIS — B182 Chronic viral hepatitis C: Secondary | ICD-10-CM | POA: Diagnosis not present

## 2020-12-24 DIAGNOSIS — D849 Immunodeficiency, unspecified: Secondary | ICD-10-CM | POA: Diagnosis not present

## 2020-12-24 DIAGNOSIS — E1165 Type 2 diabetes mellitus with hyperglycemia: Secondary | ICD-10-CM | POA: Diagnosis not present

## 2021-01-09 DIAGNOSIS — Z944 Liver transplant status: Secondary | ICD-10-CM | POA: Diagnosis not present

## 2021-01-09 DIAGNOSIS — B998 Other infectious disease: Secondary | ICD-10-CM | POA: Diagnosis not present

## 2021-01-09 DIAGNOSIS — D849 Immunodeficiency, unspecified: Secondary | ICD-10-CM | POA: Diagnosis not present

## 2021-01-09 DIAGNOSIS — Z940 Kidney transplant status: Secondary | ICD-10-CM | POA: Diagnosis not present

## 2021-01-23 DIAGNOSIS — R1031 Right lower quadrant pain: Secondary | ICD-10-CM | POA: Diagnosis not present

## 2021-01-23 DIAGNOSIS — Z940 Kidney transplant status: Secondary | ICD-10-CM | POA: Diagnosis not present

## 2021-01-23 DIAGNOSIS — D696 Thrombocytopenia, unspecified: Secondary | ICD-10-CM | POA: Diagnosis not present

## 2021-01-23 DIAGNOSIS — Z48288 Encounter for aftercare following multiple organ transplant: Secondary | ICD-10-CM | POA: Diagnosis not present

## 2021-01-23 DIAGNOSIS — C220 Liver cell carcinoma: Secondary | ICD-10-CM | POA: Diagnosis not present

## 2021-01-23 DIAGNOSIS — Z87891 Personal history of nicotine dependence: Secondary | ICD-10-CM | POA: Diagnosis not present

## 2021-01-23 DIAGNOSIS — Z944 Liver transplant status: Secondary | ICD-10-CM | POA: Diagnosis not present

## 2021-01-23 DIAGNOSIS — T8643 Liver transplant infection: Secondary | ICD-10-CM | POA: Diagnosis not present

## 2021-01-23 DIAGNOSIS — D849 Immunodeficiency, unspecified: Secondary | ICD-10-CM | POA: Diagnosis not present

## 2021-01-23 DIAGNOSIS — Z23 Encounter for immunization: Secondary | ICD-10-CM | POA: Diagnosis not present

## 2021-01-23 DIAGNOSIS — B1920 Unspecified viral hepatitis C without hepatic coma: Secondary | ICD-10-CM | POA: Diagnosis not present

## 2021-01-23 DIAGNOSIS — D84821 Immunodeficiency due to drugs: Secondary | ICD-10-CM | POA: Diagnosis not present

## 2021-01-23 DIAGNOSIS — Z8505 Personal history of malignant neoplasm of liver: Secondary | ICD-10-CM | POA: Diagnosis not present

## 2021-02-05 DIAGNOSIS — D849 Immunodeficiency, unspecified: Secondary | ICD-10-CM | POA: Diagnosis not present

## 2021-02-05 DIAGNOSIS — Z944 Liver transplant status: Secondary | ICD-10-CM | POA: Diagnosis not present

## 2021-02-05 DIAGNOSIS — Z940 Kidney transplant status: Secondary | ICD-10-CM | POA: Diagnosis not present

## 2021-02-05 DIAGNOSIS — T8643 Liver transplant infection: Secondary | ICD-10-CM | POA: Diagnosis not present

## 2021-02-12 DIAGNOSIS — D849 Immunodeficiency, unspecified: Secondary | ICD-10-CM | POA: Diagnosis not present

## 2021-02-12 DIAGNOSIS — B998 Other infectious disease: Secondary | ICD-10-CM | POA: Diagnosis not present

## 2021-02-12 DIAGNOSIS — T8643 Liver transplant infection: Secondary | ICD-10-CM | POA: Diagnosis not present

## 2021-02-12 DIAGNOSIS — Z940 Kidney transplant status: Secondary | ICD-10-CM | POA: Diagnosis not present

## 2021-02-20 DIAGNOSIS — Z940 Kidney transplant status: Secondary | ICD-10-CM | POA: Diagnosis not present

## 2021-02-20 DIAGNOSIS — B998 Other infectious disease: Secondary | ICD-10-CM | POA: Diagnosis not present

## 2021-02-20 DIAGNOSIS — D849 Immunodeficiency, unspecified: Secondary | ICD-10-CM | POA: Diagnosis not present

## 2021-02-20 DIAGNOSIS — Z944 Liver transplant status: Secondary | ICD-10-CM | POA: Diagnosis not present

## 2021-03-25 DIAGNOSIS — Z944 Liver transplant status: Secondary | ICD-10-CM | POA: Diagnosis not present

## 2021-03-25 DIAGNOSIS — Z940 Kidney transplant status: Secondary | ICD-10-CM | POA: Diagnosis not present

## 2021-03-25 DIAGNOSIS — D849 Immunodeficiency, unspecified: Secondary | ICD-10-CM | POA: Diagnosis not present

## 2021-04-15 DIAGNOSIS — Z944 Liver transplant status: Secondary | ICD-10-CM | POA: Diagnosis not present

## 2021-04-15 DIAGNOSIS — R161 Splenomegaly, not elsewhere classified: Secondary | ICD-10-CM | POA: Diagnosis not present

## 2021-04-15 DIAGNOSIS — D849 Immunodeficiency, unspecified: Secondary | ICD-10-CM | POA: Diagnosis not present

## 2021-04-15 DIAGNOSIS — Z940 Kidney transplant status: Secondary | ICD-10-CM | POA: Diagnosis not present

## 2021-04-15 DIAGNOSIS — Z48288 Encounter for aftercare following multiple organ transplant: Secondary | ICD-10-CM | POA: Diagnosis not present

## 2021-04-15 DIAGNOSIS — K766 Portal hypertension: Secondary | ICD-10-CM | POA: Diagnosis not present

## 2021-04-15 DIAGNOSIS — D84821 Immunodeficiency due to drugs: Secondary | ICD-10-CM | POA: Diagnosis not present

## 2021-04-15 DIAGNOSIS — C220 Liver cell carcinoma: Secondary | ICD-10-CM | POA: Diagnosis not present

## 2021-04-15 DIAGNOSIS — I8500 Esophageal varices without bleeding: Secondary | ICD-10-CM | POA: Diagnosis not present

## 2021-04-15 DIAGNOSIS — Z298 Encounter for other specified prophylactic measures: Secondary | ICD-10-CM | POA: Diagnosis not present

## 2021-04-15 DIAGNOSIS — K449 Diaphragmatic hernia without obstruction or gangrene: Secondary | ICD-10-CM | POA: Diagnosis not present

## 2021-04-15 DIAGNOSIS — Z1159 Encounter for screening for other viral diseases: Secondary | ICD-10-CM | POA: Diagnosis not present

## 2021-04-15 DIAGNOSIS — Z8507 Personal history of malignant neoplasm of pancreas: Secondary | ICD-10-CM | POA: Diagnosis not present

## 2021-04-15 DIAGNOSIS — T8643 Liver transplant infection: Secondary | ICD-10-CM | POA: Diagnosis not present

## 2021-04-15 DIAGNOSIS — I2510 Atherosclerotic heart disease of native coronary artery without angina pectoris: Secondary | ICD-10-CM | POA: Diagnosis not present

## 2021-04-15 DIAGNOSIS — B1920 Unspecified viral hepatitis C without hepatic coma: Secondary | ICD-10-CM | POA: Diagnosis not present

## 2021-04-24 DIAGNOSIS — I10 Essential (primary) hypertension: Secondary | ICD-10-CM | POA: Diagnosis not present

## 2021-04-24 DIAGNOSIS — Z299 Encounter for prophylactic measures, unspecified: Secondary | ICD-10-CM | POA: Diagnosis not present

## 2021-04-24 DIAGNOSIS — D692 Other nonthrombocytopenic purpura: Secondary | ICD-10-CM | POA: Diagnosis not present

## 2021-04-24 DIAGNOSIS — E1165 Type 2 diabetes mellitus with hyperglycemia: Secondary | ICD-10-CM | POA: Diagnosis not present

## 2021-04-24 DIAGNOSIS — D849 Immunodeficiency, unspecified: Secondary | ICD-10-CM | POA: Diagnosis not present

## 2021-05-04 DIAGNOSIS — Z940 Kidney transplant status: Secondary | ICD-10-CM | POA: Diagnosis not present

## 2021-05-04 DIAGNOSIS — D849 Immunodeficiency, unspecified: Secondary | ICD-10-CM | POA: Diagnosis not present

## 2021-05-04 DIAGNOSIS — Z944 Liver transplant status: Secondary | ICD-10-CM | POA: Diagnosis not present

## 2021-05-04 DIAGNOSIS — B998 Other infectious disease: Secondary | ICD-10-CM | POA: Diagnosis not present

## 2021-05-15 DIAGNOSIS — K649 Unspecified hemorrhoids: Secondary | ICD-10-CM | POA: Diagnosis not present

## 2021-05-15 DIAGNOSIS — D692 Other nonthrombocytopenic purpura: Secondary | ICD-10-CM | POA: Diagnosis not present

## 2021-05-15 DIAGNOSIS — K602 Anal fissure, unspecified: Secondary | ICD-10-CM | POA: Diagnosis not present

## 2021-05-15 DIAGNOSIS — Z299 Encounter for prophylactic measures, unspecified: Secondary | ICD-10-CM | POA: Diagnosis not present

## 2021-05-15 DIAGNOSIS — I10 Essential (primary) hypertension: Secondary | ICD-10-CM | POA: Diagnosis not present

## 2021-05-22 DIAGNOSIS — K602 Anal fissure, unspecified: Secondary | ICD-10-CM | POA: Diagnosis not present

## 2021-05-22 DIAGNOSIS — Z299 Encounter for prophylactic measures, unspecified: Secondary | ICD-10-CM | POA: Diagnosis not present

## 2021-05-22 DIAGNOSIS — B182 Chronic viral hepatitis C: Secondary | ICD-10-CM | POA: Diagnosis not present

## 2021-05-22 DIAGNOSIS — N1832 Chronic kidney disease, stage 3b: Secondary | ICD-10-CM | POA: Diagnosis not present

## 2021-05-22 DIAGNOSIS — I10 Essential (primary) hypertension: Secondary | ICD-10-CM | POA: Diagnosis not present

## 2021-05-25 DIAGNOSIS — K7460 Unspecified cirrhosis of liver: Secondary | ICD-10-CM | POA: Diagnosis not present

## 2021-05-25 DIAGNOSIS — K7581 Nonalcoholic steatohepatitis (NASH): Secondary | ICD-10-CM | POA: Diagnosis not present

## 2021-05-25 DIAGNOSIS — E1122 Type 2 diabetes mellitus with diabetic chronic kidney disease: Secondary | ICD-10-CM | POA: Diagnosis not present

## 2021-05-25 DIAGNOSIS — K612 Anorectal abscess: Secondary | ICD-10-CM | POA: Diagnosis not present

## 2021-05-25 DIAGNOSIS — K611 Rectal abscess: Secondary | ICD-10-CM | POA: Diagnosis not present

## 2021-05-25 DIAGNOSIS — Z20822 Contact with and (suspected) exposure to covid-19: Secondary | ICD-10-CM | POA: Diagnosis not present

## 2021-05-25 DIAGNOSIS — K649 Unspecified hemorrhoids: Secondary | ICD-10-CM | POA: Diagnosis not present

## 2021-05-25 DIAGNOSIS — Z8739 Personal history of other diseases of the musculoskeletal system and connective tissue: Secondary | ICD-10-CM | POA: Diagnosis not present

## 2021-05-25 DIAGNOSIS — K5730 Diverticulosis of large intestine without perforation or abscess without bleeding: Secondary | ICD-10-CM | POA: Diagnosis not present

## 2021-05-25 DIAGNOSIS — I700 Atherosclerosis of aorta: Secondary | ICD-10-CM | POA: Diagnosis not present

## 2021-05-25 DIAGNOSIS — D84821 Immunodeficiency due to drugs: Secondary | ICD-10-CM | POA: Diagnosis not present

## 2021-05-25 DIAGNOSIS — D849 Immunodeficiency, unspecified: Secondary | ICD-10-CM | POA: Diagnosis not present

## 2021-05-25 DIAGNOSIS — I129 Hypertensive chronic kidney disease with stage 1 through stage 4 chronic kidney disease, or unspecified chronic kidney disease: Secondary | ICD-10-CM | POA: Diagnosis not present

## 2021-05-25 DIAGNOSIS — Z940 Kidney transplant status: Secondary | ICD-10-CM | POA: Diagnosis not present

## 2021-05-25 DIAGNOSIS — Z944 Liver transplant status: Secondary | ICD-10-CM | POA: Diagnosis not present

## 2021-05-25 DIAGNOSIS — E1165 Type 2 diabetes mellitus with hyperglycemia: Secondary | ICD-10-CM | POA: Diagnosis not present

## 2021-05-25 DIAGNOSIS — N189 Chronic kidney disease, unspecified: Secondary | ICD-10-CM | POA: Diagnosis not present

## 2021-05-25 DIAGNOSIS — N289 Disorder of kidney and ureter, unspecified: Secondary | ICD-10-CM | POA: Diagnosis not present

## 2021-05-25 DIAGNOSIS — K6289 Other specified diseases of anus and rectum: Secondary | ICD-10-CM | POA: Diagnosis not present

## 2021-05-26 DIAGNOSIS — K611 Rectal abscess: Secondary | ICD-10-CM | POA: Diagnosis not present

## 2021-05-26 DIAGNOSIS — Z944 Liver transplant status: Secondary | ICD-10-CM | POA: Diagnosis not present

## 2021-05-26 DIAGNOSIS — D849 Immunodeficiency, unspecified: Secondary | ICD-10-CM | POA: Diagnosis not present

## 2021-05-28 DIAGNOSIS — Z944 Liver transplant status: Secondary | ICD-10-CM | POA: Diagnosis not present

## 2021-05-28 DIAGNOSIS — K603 Anal fistula: Secondary | ICD-10-CM | POA: Diagnosis not present

## 2021-05-28 DIAGNOSIS — D849 Immunodeficiency, unspecified: Secondary | ICD-10-CM | POA: Diagnosis not present

## 2021-05-28 DIAGNOSIS — Z940 Kidney transplant status: Secondary | ICD-10-CM | POA: Diagnosis not present

## 2021-06-12 DIAGNOSIS — Z940 Kidney transplant status: Secondary | ICD-10-CM | POA: Diagnosis not present

## 2021-06-12 DIAGNOSIS — Z944 Liver transplant status: Secondary | ICD-10-CM | POA: Diagnosis not present

## 2021-06-12 DIAGNOSIS — B998 Other infectious disease: Secondary | ICD-10-CM | POA: Diagnosis not present

## 2021-06-12 DIAGNOSIS — D849 Immunodeficiency, unspecified: Secondary | ICD-10-CM | POA: Diagnosis not present

## 2021-06-29 DIAGNOSIS — E039 Hypothyroidism, unspecified: Secondary | ICD-10-CM | POA: Diagnosis not present

## 2021-06-29 DIAGNOSIS — Z2821 Immunization not carried out because of patient refusal: Secondary | ICD-10-CM | POA: Diagnosis not present

## 2021-06-29 DIAGNOSIS — Z6832 Body mass index (BMI) 32.0-32.9, adult: Secondary | ICD-10-CM | POA: Diagnosis not present

## 2021-06-29 DIAGNOSIS — E669 Obesity, unspecified: Secondary | ICD-10-CM | POA: Diagnosis not present

## 2021-06-29 DIAGNOSIS — E7800 Pure hypercholesterolemia, unspecified: Secondary | ICD-10-CM | POA: Diagnosis not present

## 2021-06-29 DIAGNOSIS — Z1331 Encounter for screening for depression: Secondary | ICD-10-CM | POA: Diagnosis not present

## 2021-06-29 DIAGNOSIS — Z79899 Other long term (current) drug therapy: Secondary | ICD-10-CM | POA: Diagnosis not present

## 2021-06-29 DIAGNOSIS — Z1339 Encounter for screening examination for other mental health and behavioral disorders: Secondary | ICD-10-CM | POA: Diagnosis not present

## 2021-06-29 DIAGNOSIS — Z7189 Other specified counseling: Secondary | ICD-10-CM | POA: Diagnosis not present

## 2021-06-29 DIAGNOSIS — Z87891 Personal history of nicotine dependence: Secondary | ICD-10-CM | POA: Diagnosis not present

## 2021-06-29 DIAGNOSIS — Z0000 Encounter for general adult medical examination without abnormal findings: Secondary | ICD-10-CM | POA: Diagnosis not present

## 2021-06-29 DIAGNOSIS — I10 Essential (primary) hypertension: Secondary | ICD-10-CM | POA: Diagnosis not present

## 2021-06-29 DIAGNOSIS — R5383 Other fatigue: Secondary | ICD-10-CM | POA: Diagnosis not present

## 2021-07-10 DIAGNOSIS — Z944 Liver transplant status: Secondary | ICD-10-CM | POA: Diagnosis not present

## 2021-07-10 DIAGNOSIS — B998 Other infectious disease: Secondary | ICD-10-CM | POA: Diagnosis not present

## 2021-07-10 DIAGNOSIS — D849 Immunodeficiency, unspecified: Secondary | ICD-10-CM | POA: Diagnosis not present

## 2021-08-17 DIAGNOSIS — Z79899 Other long term (current) drug therapy: Secondary | ICD-10-CM | POA: Diagnosis not present

## 2021-08-17 DIAGNOSIS — Z23 Encounter for immunization: Secondary | ICD-10-CM | POA: Diagnosis not present

## 2021-08-17 DIAGNOSIS — D849 Immunodeficiency, unspecified: Secondary | ICD-10-CM | POA: Diagnosis not present

## 2021-08-17 DIAGNOSIS — D84821 Immunodeficiency due to drugs: Secondary | ICD-10-CM | POA: Diagnosis not present

## 2021-08-17 DIAGNOSIS — Z298 Encounter for other specified prophylactic measures: Secondary | ICD-10-CM | POA: Diagnosis not present

## 2021-08-17 DIAGNOSIS — Z4823 Encounter for aftercare following liver transplant: Secondary | ICD-10-CM | POA: Diagnosis not present

## 2021-08-17 DIAGNOSIS — Z944 Liver transplant status: Secondary | ICD-10-CM | POA: Diagnosis not present

## 2021-08-17 DIAGNOSIS — C220 Liver cell carcinoma: Secondary | ICD-10-CM | POA: Diagnosis not present

## 2021-08-17 DIAGNOSIS — Z8505 Personal history of malignant neoplasm of liver: Secondary | ICD-10-CM | POA: Diagnosis not present

## 2021-08-17 DIAGNOSIS — K610 Anal abscess: Secondary | ICD-10-CM | POA: Diagnosis not present

## 2021-08-17 DIAGNOSIS — Z940 Kidney transplant status: Secondary | ICD-10-CM | POA: Diagnosis not present

## 2021-08-17 DIAGNOSIS — Z87891 Personal history of nicotine dependence: Secondary | ICD-10-CM | POA: Diagnosis not present

## 2021-09-18 DIAGNOSIS — Z940 Kidney transplant status: Secondary | ICD-10-CM | POA: Diagnosis not present

## 2021-09-25 DIAGNOSIS — D692 Other nonthrombocytopenic purpura: Secondary | ICD-10-CM | POA: Diagnosis not present

## 2021-09-25 DIAGNOSIS — Z299 Encounter for prophylactic measures, unspecified: Secondary | ICD-10-CM | POA: Diagnosis not present

## 2021-09-25 DIAGNOSIS — E1165 Type 2 diabetes mellitus with hyperglycemia: Secondary | ICD-10-CM | POA: Diagnosis not present

## 2021-09-25 DIAGNOSIS — I10 Essential (primary) hypertension: Secondary | ICD-10-CM | POA: Diagnosis not present

## 2021-09-25 DIAGNOSIS — B182 Chronic viral hepatitis C: Secondary | ICD-10-CM | POA: Diagnosis not present

## 2021-10-09 DIAGNOSIS — Z79899 Other long term (current) drug therapy: Secondary | ICD-10-CM | POA: Diagnosis not present

## 2021-10-26 DIAGNOSIS — K766 Portal hypertension: Secondary | ICD-10-CM | POA: Diagnosis not present

## 2021-10-26 DIAGNOSIS — I2510 Atherosclerotic heart disease of native coronary artery without angina pectoris: Secondary | ICD-10-CM | POA: Diagnosis not present

## 2021-10-26 DIAGNOSIS — K603 Anal fistula: Secondary | ICD-10-CM | POA: Diagnosis not present

## 2021-10-26 DIAGNOSIS — D84821 Immunodeficiency due to drugs: Secondary | ICD-10-CM | POA: Diagnosis not present

## 2021-10-26 DIAGNOSIS — Z944 Liver transplant status: Secondary | ICD-10-CM | POA: Diagnosis not present

## 2021-10-26 DIAGNOSIS — I8500 Esophageal varices without bleeding: Secondary | ICD-10-CM | POA: Diagnosis not present

## 2021-10-26 DIAGNOSIS — Z940 Kidney transplant status: Secondary | ICD-10-CM | POA: Diagnosis not present

## 2021-10-26 DIAGNOSIS — D849 Immunodeficiency, unspecified: Secondary | ICD-10-CM | POA: Diagnosis not present

## 2021-10-26 DIAGNOSIS — C220 Liver cell carcinoma: Secondary | ICD-10-CM | POA: Diagnosis not present

## 2021-10-26 DIAGNOSIS — Z8505 Personal history of malignant neoplasm of liver: Secondary | ICD-10-CM | POA: Diagnosis not present

## 2021-10-26 DIAGNOSIS — R161 Splenomegaly, not elsewhere classified: Secondary | ICD-10-CM | POA: Diagnosis not present

## 2021-10-26 DIAGNOSIS — I700 Atherosclerosis of aorta: Secondary | ICD-10-CM | POA: Diagnosis not present

## 2021-10-28 IMAGING — CT CT HEAD W/O CM
2 series · 15 of 37 positions shown, 18 images · non-contrast
Comparison: April 10, 2019

CLINICAL DATA: Altered mental status.

EXAM:
CT HEAD WITHOUT CONTRAST
TECHNIQUE: Contiguous axial images were obtained from the base of the skull
through the vertex without intravenous contrast.

[Series 3: head bone · axial · 0.46mm/px · z∈[+1351,+1483]mm · 12 of 78 slices shown, 15 images]
[im 6/78  brain]
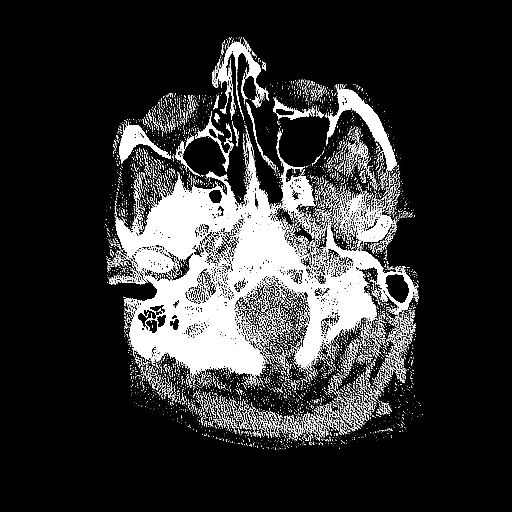
[im 6/78  bone]
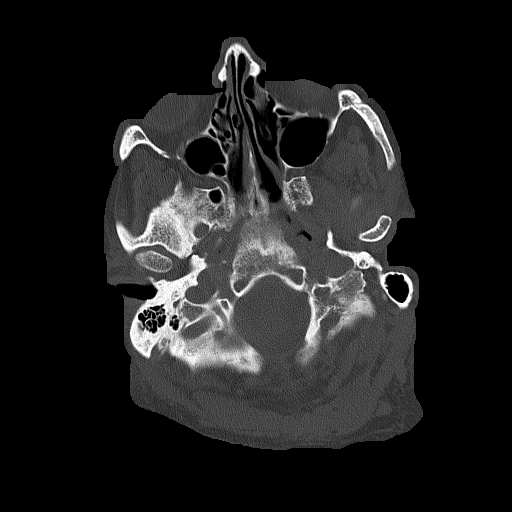
[im 11/78  brain]
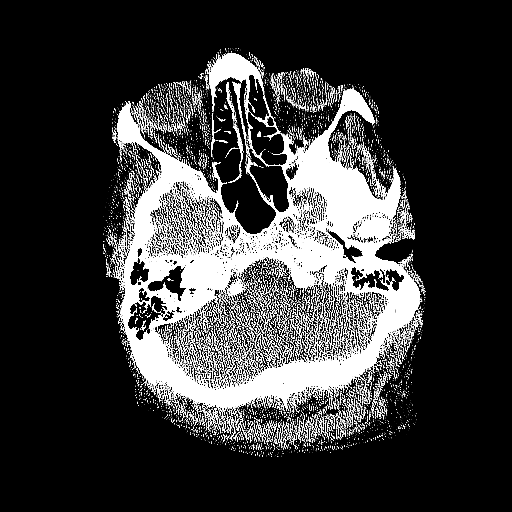
[im 16/78  brain]
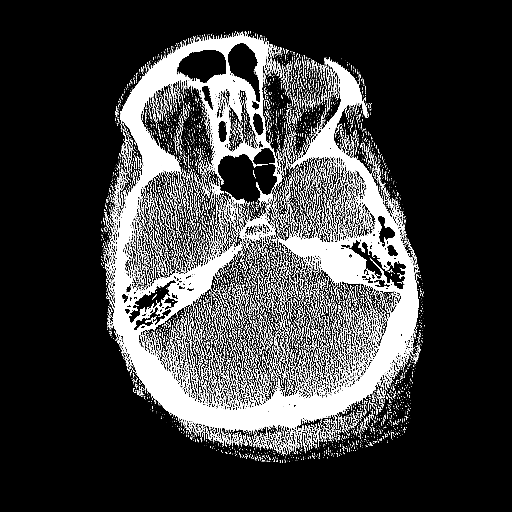
[im 24/78  brain]
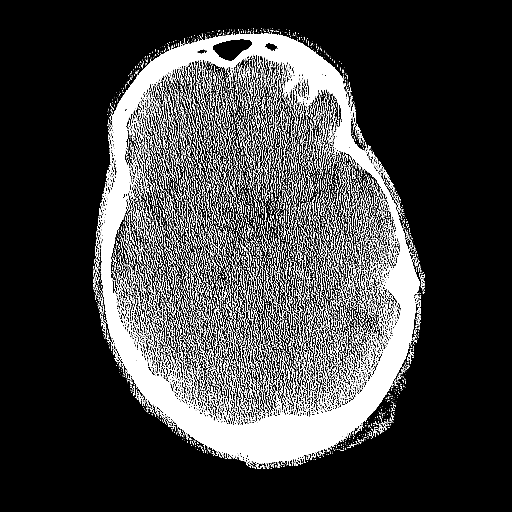
[im 30/78  brain]
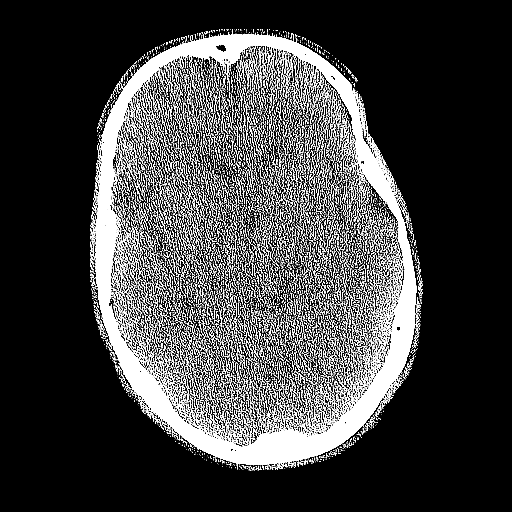
[im 30/78  bone]
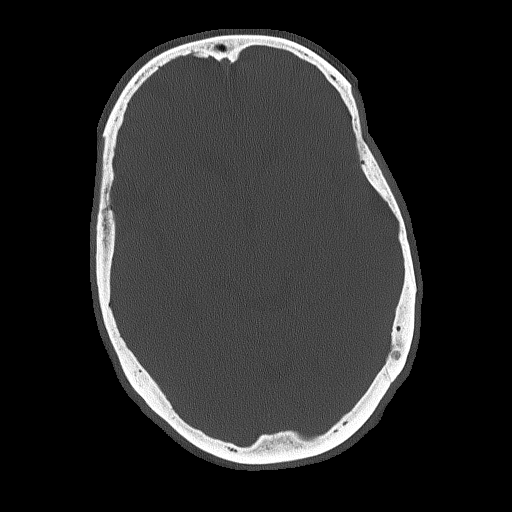
[im 35/78  brain]
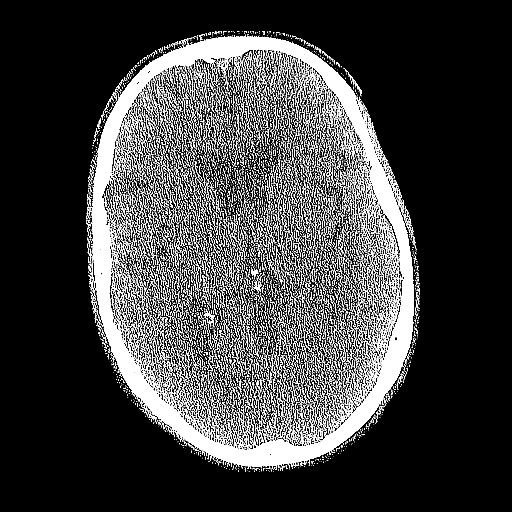
[im 43/78  brain]
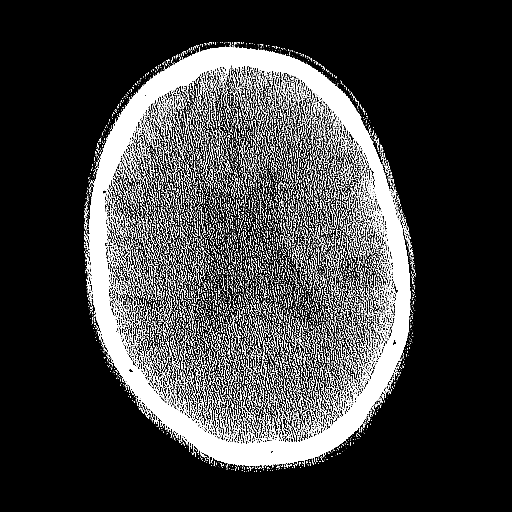
[im 48/78  brain]
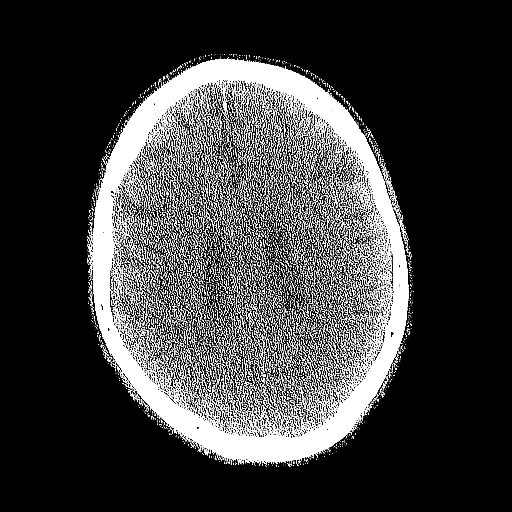
[im 54/78  brain]
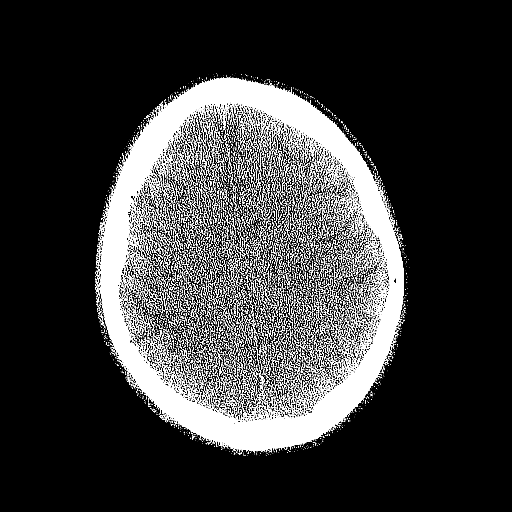
[im 54/78  bone]
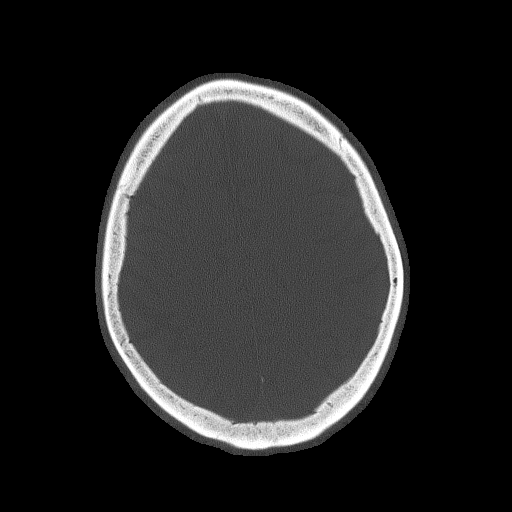
[im 62/78  brain]
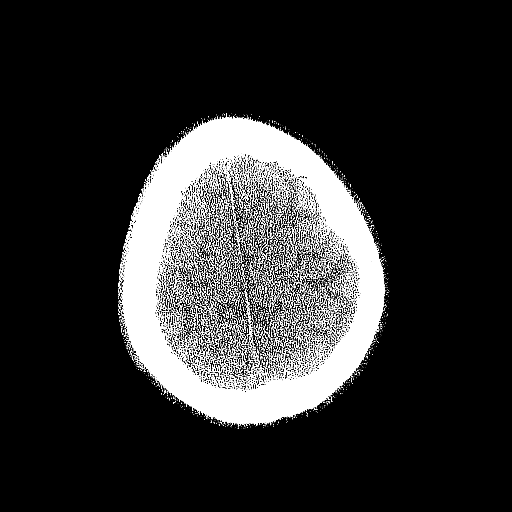
[im 67/78  brain]
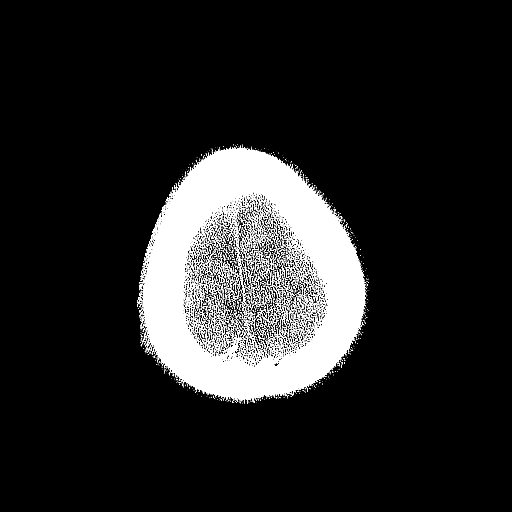
[im 72/78  brain]
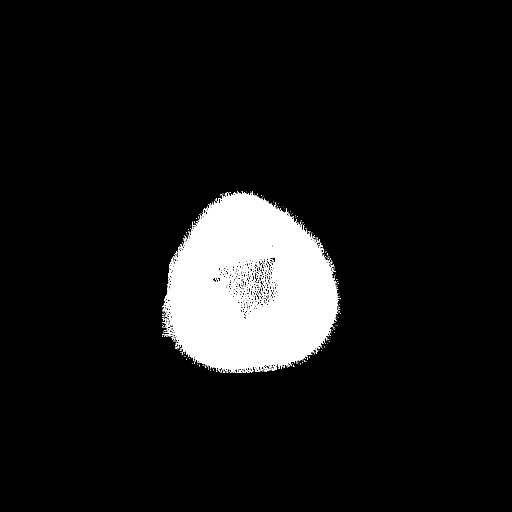

[Series 5: sagittal soft · sagittal · 0.35mm/px · 3 of 60 slices shown]
[im 20/60  brain]
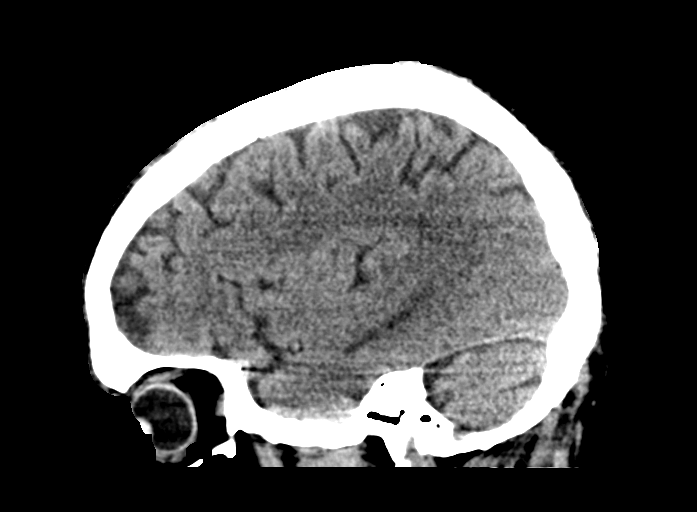
[im 30/60  brain]
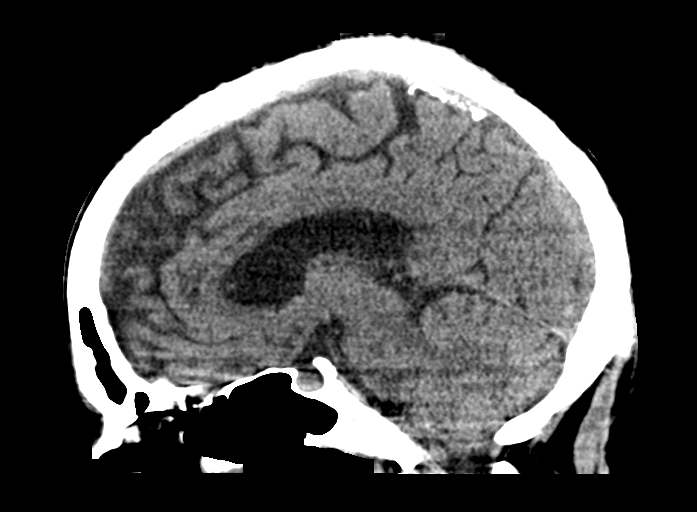
[im 40/60  brain]
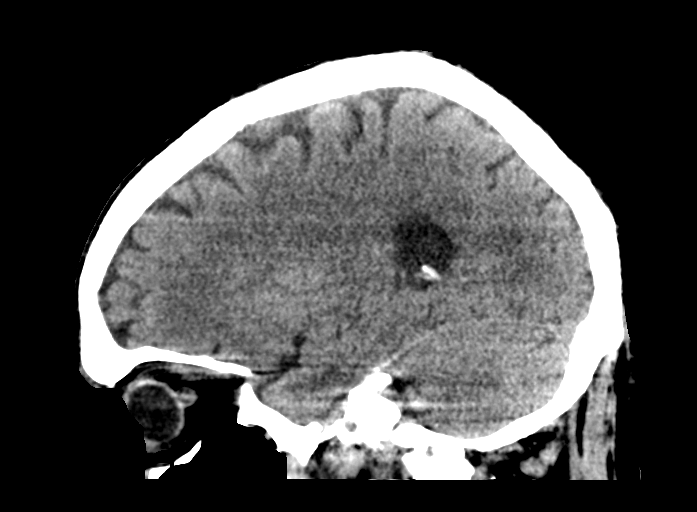

[15 of 37 positions shown; findings below may reference images not displayed]

FINDINGS: Brain: There is mild cerebral atrophy with widening of the
extra-axial spaces and ventricular dilatation.
There are areas of decreased attenuation within the white matter
tracts of the supratentorial brain, consistent with microvascular
disease changes.

Vascular: No hyperdense vessel or unexpected calcification.

Skull: Normal. Negative for fracture or focal lesion.

Sinuses/Orbits: No acute finding.

Other: None.
IMPRESSION: No acute intracranial pathology.

## 2021-11-18 DIAGNOSIS — K603 Anal fistula: Secondary | ICD-10-CM | POA: Diagnosis not present

## 2021-12-07 DIAGNOSIS — Z940 Kidney transplant status: Secondary | ICD-10-CM | POA: Diagnosis not present

## 2021-12-16 DIAGNOSIS — Z4889 Encounter for other specified surgical aftercare: Secondary | ICD-10-CM | POA: Diagnosis not present

## 2021-12-16 DIAGNOSIS — K603 Anal fistula: Secondary | ICD-10-CM | POA: Diagnosis not present

## 2022-01-04 DIAGNOSIS — Z299 Encounter for prophylactic measures, unspecified: Secondary | ICD-10-CM | POA: Diagnosis not present

## 2022-01-04 DIAGNOSIS — Z713 Dietary counseling and surveillance: Secondary | ICD-10-CM | POA: Diagnosis not present

## 2022-01-04 DIAGNOSIS — E1165 Type 2 diabetes mellitus with hyperglycemia: Secondary | ICD-10-CM | POA: Diagnosis not present

## 2022-01-04 DIAGNOSIS — I10 Essential (primary) hypertension: Secondary | ICD-10-CM | POA: Diagnosis not present

## 2022-01-25 DIAGNOSIS — Z940 Kidney transplant status: Secondary | ICD-10-CM | POA: Diagnosis not present

## 2022-01-27 DIAGNOSIS — K603 Anal fistula: Secondary | ICD-10-CM | POA: Diagnosis not present

## 2022-01-27 DIAGNOSIS — Z940 Kidney transplant status: Secondary | ICD-10-CM | POA: Diagnosis not present

## 2022-02-10 DIAGNOSIS — Z940 Kidney transplant status: Secondary | ICD-10-CM | POA: Diagnosis not present

## 2022-02-22 DIAGNOSIS — R011 Cardiac murmur, unspecified: Secondary | ICD-10-CM | POA: Diagnosis not present

## 2022-02-22 DIAGNOSIS — K610 Anal abscess: Secondary | ICD-10-CM | POA: Diagnosis not present

## 2022-02-22 DIAGNOSIS — N186 End stage renal disease: Secondary | ICD-10-CM | POA: Diagnosis not present

## 2022-02-22 DIAGNOSIS — Z5181 Encounter for therapeutic drug level monitoring: Secondary | ICD-10-CM | POA: Diagnosis not present

## 2022-02-22 DIAGNOSIS — D849 Immunodeficiency, unspecified: Secondary | ICD-10-CM | POA: Diagnosis not present

## 2022-02-22 DIAGNOSIS — E559 Vitamin D deficiency, unspecified: Secondary | ICD-10-CM | POA: Diagnosis not present

## 2022-02-22 DIAGNOSIS — E1122 Type 2 diabetes mellitus with diabetic chronic kidney disease: Secondary | ICD-10-CM | POA: Diagnosis not present

## 2022-02-22 DIAGNOSIS — Z940 Kidney transplant status: Secondary | ICD-10-CM | POA: Diagnosis not present

## 2022-02-22 DIAGNOSIS — Z4822 Encounter for aftercare following kidney transplant: Secondary | ICD-10-CM | POA: Diagnosis not present

## 2022-02-22 DIAGNOSIS — C220 Liver cell carcinoma: Secondary | ICD-10-CM | POA: Diagnosis not present

## 2022-02-22 DIAGNOSIS — D84821 Immunodeficiency due to drugs: Secondary | ICD-10-CM | POA: Diagnosis not present

## 2022-02-22 DIAGNOSIS — Z944 Liver transplant status: Secondary | ICD-10-CM | POA: Diagnosis not present

## 2022-02-22 DIAGNOSIS — K7581 Nonalcoholic steatohepatitis (NASH): Secondary | ICD-10-CM | POA: Diagnosis not present

## 2022-03-08 DIAGNOSIS — Z944 Liver transplant status: Secondary | ICD-10-CM | POA: Diagnosis not present

## 2022-03-10 DIAGNOSIS — Z299 Encounter for prophylactic measures, unspecified: Secondary | ICD-10-CM | POA: Diagnosis not present

## 2022-03-10 DIAGNOSIS — Z7189 Other specified counseling: Secondary | ICD-10-CM | POA: Diagnosis not present

## 2022-03-10 DIAGNOSIS — B182 Chronic viral hepatitis C: Secondary | ICD-10-CM | POA: Diagnosis not present

## 2022-03-10 DIAGNOSIS — Z1331 Encounter for screening for depression: Secondary | ICD-10-CM | POA: Diagnosis not present

## 2022-03-10 DIAGNOSIS — E6609 Other obesity due to excess calories: Secondary | ICD-10-CM | POA: Diagnosis not present

## 2022-03-10 DIAGNOSIS — Z1339 Encounter for screening examination for other mental health and behavioral disorders: Secondary | ICD-10-CM | POA: Diagnosis not present

## 2022-03-10 DIAGNOSIS — I10 Essential (primary) hypertension: Secondary | ICD-10-CM | POA: Diagnosis not present

## 2022-03-10 DIAGNOSIS — Z6832 Body mass index (BMI) 32.0-32.9, adult: Secondary | ICD-10-CM | POA: Diagnosis not present

## 2022-03-10 DIAGNOSIS — Z0000 Encounter for general adult medical examination without abnormal findings: Secondary | ICD-10-CM | POA: Diagnosis not present

## 2022-03-11 DIAGNOSIS — R011 Cardiac murmur, unspecified: Secondary | ICD-10-CM | POA: Diagnosis not present

## 2022-03-11 DIAGNOSIS — I517 Cardiomegaly: Secondary | ICD-10-CM | POA: Diagnosis not present

## 2022-03-25 DIAGNOSIS — Z944 Liver transplant status: Secondary | ICD-10-CM | POA: Diagnosis not present

## 2022-03-25 DIAGNOSIS — D849 Immunodeficiency, unspecified: Secondary | ICD-10-CM | POA: Diagnosis not present

## 2022-04-06 DIAGNOSIS — H5200 Hypermetropia, unspecified eye: Secondary | ICD-10-CM | POA: Diagnosis not present

## 2022-04-07 DIAGNOSIS — C220 Liver cell carcinoma: Secondary | ICD-10-CM | POA: Diagnosis not present

## 2022-04-07 DIAGNOSIS — E039 Hypothyroidism, unspecified: Secondary | ICD-10-CM | POA: Diagnosis not present

## 2022-04-07 DIAGNOSIS — K603 Anal fistula: Secondary | ICD-10-CM | POA: Diagnosis not present

## 2022-04-07 DIAGNOSIS — M1990 Unspecified osteoarthritis, unspecified site: Secondary | ICD-10-CM | POA: Diagnosis not present

## 2022-04-07 DIAGNOSIS — D630 Anemia in neoplastic disease: Secondary | ICD-10-CM | POA: Diagnosis not present

## 2022-04-07 DIAGNOSIS — K769 Liver disease, unspecified: Secondary | ICD-10-CM | POA: Diagnosis not present

## 2022-04-07 DIAGNOSIS — I129 Hypertensive chronic kidney disease with stage 1 through stage 4 chronic kidney disease, or unspecified chronic kidney disease: Secondary | ICD-10-CM | POA: Diagnosis not present

## 2022-04-07 DIAGNOSIS — N189 Chronic kidney disease, unspecified: Secondary | ICD-10-CM | POA: Diagnosis not present

## 2022-04-07 DIAGNOSIS — E1122 Type 2 diabetes mellitus with diabetic chronic kidney disease: Secondary | ICD-10-CM | POA: Diagnosis not present

## 2022-04-29 DIAGNOSIS — C220 Liver cell carcinoma: Secondary | ICD-10-CM | POA: Diagnosis not present

## 2022-04-29 DIAGNOSIS — Z940 Kidney transplant status: Secondary | ICD-10-CM | POA: Diagnosis not present

## 2022-04-29 DIAGNOSIS — Z944 Liver transplant status: Secondary | ICD-10-CM | POA: Diagnosis not present

## 2022-04-29 DIAGNOSIS — D849 Immunodeficiency, unspecified: Secondary | ICD-10-CM | POA: Diagnosis not present

## 2022-04-29 DIAGNOSIS — K766 Portal hypertension: Secondary | ICD-10-CM | POA: Diagnosis not present

## 2022-04-29 DIAGNOSIS — I2510 Atherosclerotic heart disease of native coronary artery without angina pectoris: Secondary | ICD-10-CM | POA: Diagnosis not present

## 2022-04-29 DIAGNOSIS — K862 Cyst of pancreas: Secondary | ICD-10-CM | POA: Diagnosis not present

## 2022-04-29 DIAGNOSIS — I7121 Aneurysm of the ascending aorta, without rupture: Secondary | ICD-10-CM | POA: Diagnosis not present

## 2022-05-03 DIAGNOSIS — E1165 Type 2 diabetes mellitus with hyperglycemia: Secondary | ICD-10-CM | POA: Diagnosis not present

## 2022-05-03 DIAGNOSIS — R011 Cardiac murmur, unspecified: Secondary | ICD-10-CM | POA: Diagnosis not present

## 2022-05-03 DIAGNOSIS — Z299 Encounter for prophylactic measures, unspecified: Secondary | ICD-10-CM | POA: Diagnosis not present

## 2022-05-03 DIAGNOSIS — I10 Essential (primary) hypertension: Secondary | ICD-10-CM | POA: Diagnosis not present

## 2022-05-03 DIAGNOSIS — Z6832 Body mass index (BMI) 32.0-32.9, adult: Secondary | ICD-10-CM | POA: Diagnosis not present

## 2022-05-03 DIAGNOSIS — D849 Immunodeficiency, unspecified: Secondary | ICD-10-CM | POA: Diagnosis not present

## 2022-05-19 DIAGNOSIS — Z4889 Encounter for other specified surgical aftercare: Secondary | ICD-10-CM | POA: Diagnosis not present

## 2022-07-05 DIAGNOSIS — Z79899 Other long term (current) drug therapy: Secondary | ICD-10-CM | POA: Diagnosis not present

## 2022-07-05 DIAGNOSIS — Z6832 Body mass index (BMI) 32.0-32.9, adult: Secondary | ICD-10-CM | POA: Diagnosis not present

## 2022-07-05 DIAGNOSIS — E039 Hypothyroidism, unspecified: Secondary | ICD-10-CM | POA: Diagnosis not present

## 2022-07-05 DIAGNOSIS — Z125 Encounter for screening for malignant neoplasm of prostate: Secondary | ICD-10-CM | POA: Diagnosis not present

## 2022-07-05 DIAGNOSIS — R5383 Other fatigue: Secondary | ICD-10-CM | POA: Diagnosis not present

## 2022-07-05 DIAGNOSIS — Z0000 Encounter for general adult medical examination without abnormal findings: Secondary | ICD-10-CM | POA: Diagnosis not present

## 2022-07-05 DIAGNOSIS — Z299 Encounter for prophylactic measures, unspecified: Secondary | ICD-10-CM | POA: Diagnosis not present

## 2022-07-05 DIAGNOSIS — E7800 Pure hypercholesterolemia, unspecified: Secondary | ICD-10-CM | POA: Diagnosis not present

## 2022-07-05 DIAGNOSIS — I10 Essential (primary) hypertension: Secondary | ICD-10-CM | POA: Diagnosis not present

## 2022-07-20 ENCOUNTER — Ambulatory Visit: Payer: Medicare HMO | Attending: Cardiology | Admitting: Internal Medicine

## 2022-07-20 ENCOUNTER — Ambulatory Visit: Payer: Medicare HMO | Admitting: Cardiology

## 2022-07-20 ENCOUNTER — Encounter: Payer: Self-pay | Admitting: Internal Medicine

## 2022-07-20 VITALS — BP 118/80 | HR 73 | Ht 73.0 in | Wt 238.6 lb

## 2022-07-20 DIAGNOSIS — Z79899 Other long term (current) drug therapy: Secondary | ICD-10-CM

## 2022-07-20 DIAGNOSIS — I10 Essential (primary) hypertension: Secondary | ICD-10-CM | POA: Insufficient documentation

## 2022-07-20 DIAGNOSIS — I2510 Atherosclerotic heart disease of native coronary artery without angina pectoris: Secondary | ICD-10-CM | POA: Insufficient documentation

## 2022-07-20 DIAGNOSIS — I2584 Coronary atherosclerosis due to calcified coronary lesion: Secondary | ICD-10-CM | POA: Diagnosis not present

## 2022-07-20 DIAGNOSIS — I7121 Aneurysm of the ascending aorta, without rupture: Secondary | ICD-10-CM

## 2022-07-20 DIAGNOSIS — I712 Thoracic aortic aneurysm, without rupture, unspecified: Secondary | ICD-10-CM | POA: Diagnosis not present

## 2022-07-20 MED ORDER — NITROGLYCERIN 0.4 MG SL SUBL: 0.4000 mg | SUBLINGUAL_TABLET | SUBLINGUAL | 2 refills | Status: AC | PRN

## 2022-07-20 NOTE — Patient Instructions (Addendum)
Medication Instructions:  Your physician recommends that you continue on your current medications as directed. Please refer to the Current Medication list given to you today. Nitroglycerin 0.4 mg sublingual tablets sent to your pharmacy. Dissolve one under tongue for severe chest pain every 5 minutes up to 3 doses. If no relief, proceed to ED.  Labwork: Your physician recommends that you return for a FASTING lipid as soon as possible. Please do not eat or drink for at least 8 hours when you have this done. You may take your medications that morning with a sip of water. You request to have this done at your family doctor's office. Please contact them to arrange this.  Testing/Procedures: Chest CTA in August 2024 at [place]: Your physician recommends that you schedule a follow-up appointment in: 1 year. You will receive a reminder call in the mail in about 10 months reminding you to call and schedule your appointment. If you don't receive this call, please contact our office. Contact our office for a sooner appointment if you have frequent chest pain.   Any Other Special Instructions Will Be Listed Below (If Applicable).  If you need a refill on your cardiac medications before your next appointment, please call your pharmacy.

## 2022-07-20 NOTE — Progress Notes (Signed)
Cardiology Office Note  Date: 07/20/2022   ID: ASCENSION STFLEUR, DOB 1951-01-09, MRN [id number]  PCP:  Glenda Chroman, MD  Cardiologist:  Chalmers Guest, MD Electrophysiologist:  None   Reason for Office Visit: Evaluation of severe coronary artery calcifications and thoracic aortic aneurysm follow-up at the request of Dr. Loletha Grayer.  History of Present Illness: TANVEER BRAMMER is a 71 y.o. male known to have HTN, DM 2, liver cirrhosis s/p liver transplantation in 2021, CKD 4 s/p kidney transplantation in 2021, anal fistula s/p lift procedure (ligation of the intersphincteric fistula tract) in 2022 was referred to the cardiology clinic for evaluation of severe coronary artery calcifications and thoracic aortic aneurysm that was found on one of his CT chest scans recently in 8/23.  No ischemic evaluation.  No prior history of MI/PCI/CABG. Patient reported chest heaviness lasting for 1 min to 5 min with strenuous exertion and subsides spontaneously with rest. This chest heaviness was started 2 months ago the frequency has been once in a while on with strenuous activities. Patient is also not able to recall the frequency and said it is not frequent at all.  Denied any DOE, lightheadedness, dizziness, syncope, LE swelling, palpitations, claudication. Denies smoking cigarettes, illicit drug abuse or alcohol use.   Past Medical History:  Diagnosis Date   Diabetes ([place])    Hypothyroid    NAFLD (nonalcoholic fatty liver disease)     Past Surgical History:  Procedure Laterality Date   carpal tunnel rt hand     COLONOSCOPY N/A 12/06/2019   Procedure: COLONOSCOPY;  Surgeon: Rogene Houston, MD;  [facility];  Service: Endoscopy;  Laterality: N/A;   ESOPHAGOGASTRODUODENOSCOPY N/A 02/10/2018   Procedure: ESOPHAGOGASTRODUODENOSCOPY (EGD);  Surgeon: Rogene Houston, MD;  [facility];  Service: Endoscopy;  Laterality: N/A;  2:10   ESOPHAGOGASTRODUODENOSCOPY N/A 05/24/2019    Procedure: ESOPHAGOGASTRODUODENOSCOPY (EGD);  Surgeon: Rogene Houston, MD;  [facility];  Service: Endoscopy;  Laterality: N/A;  730   ESOPHAGOGASTRODUODENOSCOPY N/A 12/06/2019   Procedure: ESOPHAGOGASTRODUODENOSCOPY (EGD);  Surgeon: Rogene Houston, MD;  [facility];  Service: Endoscopy;  Laterality: N/A;  0:62   umblical hernia repair      Current Outpatient Medications  Medication Sig Dispense Refill   acetaminophen (TYLENOL) 500 MG tablet Take 500 mg by mouth as needed.     aspirin EC 81 MG tablet Take 81 mg by mouth every morning. Swallow whole.     Cholecalciferol (VITAMIN D3) 125 MCG (5000 UT) CAPS Take 1 capsule (5,000 Units total) by mouth daily. (Patient taking differently: Take 1 caplet at 1:00 & 1 caplet at 5:00) 90 capsule 1   glipiZIDE (GLUCOTROL XL) 2.5 MG 24 hr tablet Take 2.5 mg by mouth daily with breakfast.     levothyroxine (SYNTHROID) 125 MCG tablet TAKE 1 TABLET BY MOUTH ONCE DAILY BEFORE BREAKFAST 90 tablet 0   Magnesium 400 MG TABS Take by mouth. 2 caps at 1:00 & 2 caps at 5:00     mycophenolate (CELLCEPT) 250 MG capsule Take by mouth. Take 2 tabs every morning & 1 tab every evening     nitroGLYCERIN (NITROSTAT) 0.4 MG SL tablet Place 1 tablet (0.4 mg total) under the tongue every 5 (five) minutes x 3 doses as needed for chest pain (if no relief after 3rd dose, proceed to ED or call 911). 25 tablet 2   predniSONE (DELTASONE) 5 MG tablet Take 5 mg by  mouth every morning.     tacrolimus (PROGRAF) 0.5 MG capsule Take by mouth. 2 caps every morning & 3 caps at bedtime     furosemide (LASIX) 20 MG tablet Take 40 mg by mouth daily after breakfast.  (Patient not taking: Reported on 07/20/2022)     lisinopril (ZESTRIL) 20 MG tablet Take 1 tablet (20 mg total) by mouth daily. 30 tablet 11   No current facility-administered medications for this visit.   Allergies:  Patient has no known allergies.   Social History: The patient  reports that he quit  smoking about 24 years ago. His smoking use included cigarettes. He smoked an average of 1.5 packs per day. He has never used smokeless tobacco. He reports that he does not drink alcohol and does not use drugs.   Family History: The patient's family history includes Liver cancer in his father.   ROS:  Please see the history of present illness. Otherwise, complete review of systems is positive for none.  All other systems are reviewed and negative.   Physical Exam: VS:  BP 118/80   Pulse 73   Ht 6' 1"  (1.854 m)   Wt 238 lb 9.6 oz (108.2 kg)   SpO2 95%   BMI 31.48 kg/m , BMI Body mass index is 31.48 kg/m.  Wt Readings from Last 3 Encounters:  07/20/22 238 lb 9.6 oz (108.2 kg)  10/26/19 220 lb (99.8 kg)  09/27/19 231 lb 9.6 oz (105.1 kg)    General: Patient appears comfortable at rest. HEENT: Conjunctiva and lids normal, oropharynx clear with moist mucosa. Neck: Supple, no elevated JVP or carotid bruits, no thyromegaly. Lungs: Clear to auscultation, nonlabored breathing at rest. Cardiac: Regular rate and rhythm, no S3 or significant systolic murmur, no pericardial rub. Abdomen: Soft, nontender, no hepatomegaly, bowel sounds present, no guarding or rebound. Extremities: No pitting edema, distal pulses 2+. Skin: Warm and dry. Musculoskeletal: No kyphosis. Neuropsychiatric: Alert and oriented x3, affect grossly appropriate.  ECG:  An ECG dated 07/20/22 was personally reviewed today and demonstrated:  NSR  Recent Labwork: LDL 20s in 2021  Other Studies Reviewed Today: Echo in 02/2022 Normal LVEF, mild LVH No significant valvular regurgitation or stenosis  CT chest without contrast in 8/23 The heart is normal in size with no pericardial effusion. Thoracic aorta is dilated,  measuring up to 4.6 cm in diameter, unchanged from prior. Normal caliber  pulmonary artery. Severe coronary artery calcifications. Severe aortic  valvular calcifications.Dense mitral annular calcifications.    Assessment and Plan: Patient is a 71 year old M known to have HTN, DM 2, s/p liver and kidney transplantation in 2021 was referred to cardiology clinic for evaluation of severe coronary artery calcifications and thoracic aortic aneurysm 4.6 cm.  #Severe coronary artery calcifications Plan -Patient has infrequent angina for which SL NTG 0.4 mg as needed is recommended. ER precautions for chest pain provided. -Continue aspirin 81 mg once daily -Patient will need to be on statin therapy due to the presence of coronary artery calcifications however his LDL was in 20s in 2021. Will obtain new lipid panel from his PCP and if LDL is elevated, he will need to be started on a statin compatible with his immunosuppressant medications. Goal LDL will be less than 100.  #Thoracic aortic aneurysm 4.6 cm in 04/2022 Plan -Repeat CTA thoracic aorta imaging every year. Next study will be due in 2024. Orders placed.  #HTN, controlled Plan -Blood pressure controlled without being on antihypertensive medications. Follow with his PCP.  I have spent a total of 45 minutes with patient reviewing chart , EKGs, labs and examining patient as well as establishing an assessment and plan that was discussed with the patient.  > 50% of time was spent in direct patient care.      Medication Adjustments/Labs and Tests Ordered: Current medicines are reviewed at length with the patient today.  Concerns regarding medicines are outlined above.   Tests Ordered: Orders Placed This Encounter  Procedures   CT ANGIO CHEST AORTA W/CM & OR WO/CM   Lipid panel   EKG 12-Lead    Medication Changes: Meds ordered this encounter  Medications   nitroGLYCERIN (NITROSTAT) 0.4 MG SL tablet    Sig: Place 1 tablet (0.4 mg total) under the tongue every 5 (five) minutes x 3 doses as needed for chest pain (if no relief after 3rd dose, proceed to ED or call 911).    Dispense:  25 tablet    Refill:  2    Disposition:  Follow up  one  year  Signed Leesa Leifheit Fidel Levy, MD, 07/20/2022 12:51 PM    [facility] [place], [place], [place] [address]

## 2022-07-28 DIAGNOSIS — Z944 Liver transplant status: Secondary | ICD-10-CM | POA: Diagnosis not present

## 2022-08-11 DIAGNOSIS — I152 Hypertension secondary to endocrine disorders: Secondary | ICD-10-CM | POA: Diagnosis not present

## 2022-08-11 DIAGNOSIS — I10 Essential (primary) hypertension: Secondary | ICD-10-CM | POA: Diagnosis not present

## 2022-08-11 DIAGNOSIS — Z299 Encounter for prophylactic measures, unspecified: Secondary | ICD-10-CM | POA: Diagnosis not present

## 2022-08-11 DIAGNOSIS — E1159 Type 2 diabetes mellitus with other circulatory complications: Secondary | ICD-10-CM | POA: Diagnosis not present

## 2022-08-16 DIAGNOSIS — B029 Zoster without complications: Secondary | ICD-10-CM | POA: Diagnosis not present

## 2022-08-16 DIAGNOSIS — B182 Chronic viral hepatitis C: Secondary | ICD-10-CM | POA: Diagnosis not present

## 2022-08-16 DIAGNOSIS — Z299 Encounter for prophylactic measures, unspecified: Secondary | ICD-10-CM | POA: Diagnosis not present

## 2022-08-16 DIAGNOSIS — N1832 Chronic kidney disease, stage 3b: Secondary | ICD-10-CM | POA: Diagnosis not present

## 2022-08-16 DIAGNOSIS — I10 Essential (primary) hypertension: Secondary | ICD-10-CM | POA: Diagnosis not present

## 2022-08-23 DIAGNOSIS — Z48288 Encounter for aftercare following multiple organ transplant: Secondary | ICD-10-CM | POA: Diagnosis not present

## 2022-08-23 DIAGNOSIS — Z79899 Other long term (current) drug therapy: Secondary | ICD-10-CM | POA: Diagnosis not present

## 2022-08-23 DIAGNOSIS — D84821 Immunodeficiency due to drugs: Secondary | ICD-10-CM | POA: Diagnosis not present

## 2022-08-23 DIAGNOSIS — Z944 Liver transplant status: Secondary | ICD-10-CM | POA: Diagnosis not present

## 2022-08-23 DIAGNOSIS — Z87891 Personal history of nicotine dependence: Secondary | ICD-10-CM | POA: Diagnosis not present

## 2022-08-23 DIAGNOSIS — D849 Immunodeficiency, unspecified: Secondary | ICD-10-CM | POA: Diagnosis not present

## 2022-08-23 DIAGNOSIS — R1031 Right lower quadrant pain: Secondary | ICD-10-CM | POA: Diagnosis not present

## 2022-08-23 DIAGNOSIS — Z940 Kidney transplant status: Secondary | ICD-10-CM | POA: Diagnosis not present

## 2022-08-23 DIAGNOSIS — Z23 Encounter for immunization: Secondary | ICD-10-CM | POA: Diagnosis not present

## 2022-09-02 DIAGNOSIS — D485 Neoplasm of uncertain behavior of skin: Secondary | ICD-10-CM | POA: Diagnosis not present

## 2022-09-02 DIAGNOSIS — Z1283 Encounter for screening for malignant neoplasm of skin: Secondary | ICD-10-CM | POA: Diagnosis not present

## 2022-09-02 DIAGNOSIS — L821 Other seborrheic keratosis: Secondary | ICD-10-CM | POA: Diagnosis not present

## 2022-09-02 DIAGNOSIS — D225 Melanocytic nevi of trunk: Secondary | ICD-10-CM | POA: Diagnosis not present

## 2022-10-26 DIAGNOSIS — R1011 Right upper quadrant pain: Secondary | ICD-10-CM | POA: Diagnosis not present

## 2022-10-26 DIAGNOSIS — Z940 Kidney transplant status: Secondary | ICD-10-CM | POA: Diagnosis not present

## 2022-10-26 DIAGNOSIS — S0990XA Unspecified injury of head, initial encounter: Secondary | ICD-10-CM | POA: Diagnosis not present

## 2022-10-26 DIAGNOSIS — I700 Atherosclerosis of aorta: Secondary | ICD-10-CM | POA: Diagnosis not present

## 2022-10-26 DIAGNOSIS — I6789 Other cerebrovascular disease: Secondary | ICD-10-CM | POA: Diagnosis not present

## 2022-10-26 DIAGNOSIS — S199XXA Unspecified injury of neck, initial encounter: Secondary | ICD-10-CM | POA: Diagnosis not present

## 2022-10-26 DIAGNOSIS — R200 Anesthesia of skin: Secondary | ICD-10-CM | POA: Diagnosis not present

## 2022-10-26 DIAGNOSIS — Z041 Encounter for examination and observation following transport accident: Secondary | ICD-10-CM | POA: Diagnosis not present

## 2022-10-26 DIAGNOSIS — M549 Dorsalgia, unspecified: Secondary | ICD-10-CM | POA: Diagnosis not present

## 2022-10-26 DIAGNOSIS — Z944 Liver transplant status: Secondary | ICD-10-CM | POA: Diagnosis not present

## 2022-10-26 DIAGNOSIS — M545 Low back pain, unspecified: Secondary | ICD-10-CM | POA: Diagnosis not present

## 2022-10-26 DIAGNOSIS — R937 Abnormal findings on diagnostic imaging of other parts of musculoskeletal system: Secondary | ICD-10-CM | POA: Diagnosis not present

## 2022-10-26 DIAGNOSIS — M5021 Other cervical disc displacement,  high cervical region: Secondary | ICD-10-CM | POA: Diagnosis not present

## 2022-11-05 DIAGNOSIS — Z23 Encounter for immunization: Secondary | ICD-10-CM | POA: Diagnosis not present

## 2022-11-05 DIAGNOSIS — Z944 Liver transplant status: Secondary | ICD-10-CM | POA: Diagnosis not present

## 2022-11-05 DIAGNOSIS — D84821 Immunodeficiency due to drugs: Secondary | ICD-10-CM | POA: Diagnosis not present

## 2022-11-05 DIAGNOSIS — Z48288 Encounter for aftercare following multiple organ transplant: Secondary | ICD-10-CM | POA: Diagnosis not present

## 2022-11-05 DIAGNOSIS — M542 Cervicalgia: Secondary | ICD-10-CM | POA: Diagnosis not present

## 2022-11-05 DIAGNOSIS — Z940 Kidney transplant status: Secondary | ICD-10-CM | POA: Diagnosis not present

## 2022-11-05 DIAGNOSIS — Z79899 Other long term (current) drug therapy: Secondary | ICD-10-CM | POA: Diagnosis not present

## 2022-11-05 DIAGNOSIS — Z87891 Personal history of nicotine dependence: Secondary | ICD-10-CM | POA: Diagnosis not present

## 2022-11-05 DIAGNOSIS — M25561 Pain in right knee: Secondary | ICD-10-CM | POA: Diagnosis not present

## 2022-11-12 DIAGNOSIS — M79661 Pain in right lower leg: Secondary | ICD-10-CM | POA: Diagnosis not present

## 2022-11-12 DIAGNOSIS — M79669 Pain in unspecified lower leg: Secondary | ICD-10-CM | POA: Diagnosis not present

## 2022-11-17 DIAGNOSIS — M542 Cervicalgia: Secondary | ICD-10-CM | POA: Diagnosis not present

## 2022-11-17 DIAGNOSIS — M5440 Lumbago with sciatica, unspecified side: Secondary | ICD-10-CM | POA: Diagnosis not present

## 2022-11-17 DIAGNOSIS — M25561 Pain in right knee: Secondary | ICD-10-CM | POA: Diagnosis not present

## 2022-11-24 DIAGNOSIS — M5440 Lumbago with sciatica, unspecified side: Secondary | ICD-10-CM | POA: Diagnosis not present

## 2022-11-24 DIAGNOSIS — M25561 Pain in right knee: Secondary | ICD-10-CM | POA: Diagnosis not present

## 2022-11-24 DIAGNOSIS — M542 Cervicalgia: Secondary | ICD-10-CM | POA: Diagnosis not present

## 2022-11-25 DIAGNOSIS — M5440 Lumbago with sciatica, unspecified side: Secondary | ICD-10-CM | POA: Diagnosis not present

## 2022-11-25 DIAGNOSIS — M542 Cervicalgia: Secondary | ICD-10-CM | POA: Diagnosis not present

## 2022-11-25 DIAGNOSIS — M25561 Pain in right knee: Secondary | ICD-10-CM | POA: Diagnosis not present

## 2022-11-29 DIAGNOSIS — Z940 Kidney transplant status: Secondary | ICD-10-CM | POA: Diagnosis not present

## 2022-11-29 DIAGNOSIS — Z5181 Encounter for therapeutic drug level monitoring: Secondary | ICD-10-CM | POA: Diagnosis not present

## 2022-11-29 DIAGNOSIS — Z79899 Other long term (current) drug therapy: Secondary | ICD-10-CM | POA: Diagnosis not present

## 2022-11-29 DIAGNOSIS — B349 Viral infection, unspecified: Secondary | ICD-10-CM | POA: Diagnosis not present

## 2022-11-29 DIAGNOSIS — I2510 Atherosclerotic heart disease of native coronary artery without angina pectoris: Secondary | ICD-10-CM | POA: Diagnosis not present

## 2022-11-29 DIAGNOSIS — I70201 Unspecified atherosclerosis of native arteries of extremities, right leg: Secondary | ICD-10-CM | POA: Diagnosis not present

## 2022-11-29 DIAGNOSIS — B259 Cytomegaloviral disease, unspecified: Secondary | ICD-10-CM | POA: Diagnosis not present

## 2022-11-30 DIAGNOSIS — M5440 Lumbago with sciatica, unspecified side: Secondary | ICD-10-CM | POA: Diagnosis not present

## 2022-11-30 DIAGNOSIS — M542 Cervicalgia: Secondary | ICD-10-CM | POA: Diagnosis not present

## 2022-11-30 DIAGNOSIS — M25561 Pain in right knee: Secondary | ICD-10-CM | POA: Diagnosis not present

## 2022-12-02 DIAGNOSIS — M5440 Lumbago with sciatica, unspecified side: Secondary | ICD-10-CM | POA: Diagnosis not present

## 2022-12-02 DIAGNOSIS — M25561 Pain in right knee: Secondary | ICD-10-CM | POA: Diagnosis not present

## 2022-12-02 DIAGNOSIS — M542 Cervicalgia: Secondary | ICD-10-CM | POA: Diagnosis not present

## 2022-12-03 DIAGNOSIS — M25561 Pain in right knee: Secondary | ICD-10-CM | POA: Diagnosis not present

## 2022-12-06 ENCOUNTER — Telehealth: Payer: Self-pay | Admitting: *Deleted

## 2022-12-06 NOTE — Telephone Encounter (Signed)
-----   Message from Chalmers Guest, MD sent at 12/05/2022  8:01 AM EDT ----- Regarding: LDL report I resulted on his scanned LDL report. Let the patient know that if his chest heaviness becomes frequent, he will need to see me sooner and wait till the next scheduled appointment.

## 2022-12-06 NOTE — Telephone Encounter (Addendum)
Patient informed and verbalized understanding of plan. Reports that he has been doing well

## 2022-12-08 DIAGNOSIS — M5440 Lumbago with sciatica, unspecified side: Secondary | ICD-10-CM | POA: Diagnosis not present

## 2022-12-08 DIAGNOSIS — M25561 Pain in right knee: Secondary | ICD-10-CM | POA: Diagnosis not present

## 2022-12-08 DIAGNOSIS — M542 Cervicalgia: Secondary | ICD-10-CM | POA: Diagnosis not present

## 2022-12-10 DIAGNOSIS — M25561 Pain in right knee: Secondary | ICD-10-CM | POA: Diagnosis not present

## 2022-12-10 DIAGNOSIS — M5440 Lumbago with sciatica, unspecified side: Secondary | ICD-10-CM | POA: Diagnosis not present

## 2022-12-10 DIAGNOSIS — M542 Cervicalgia: Secondary | ICD-10-CM | POA: Diagnosis not present

## 2022-12-14 DIAGNOSIS — M5440 Lumbago with sciatica, unspecified side: Secondary | ICD-10-CM | POA: Diagnosis not present

## 2022-12-14 DIAGNOSIS — M25561 Pain in right knee: Secondary | ICD-10-CM | POA: Diagnosis not present

## 2022-12-14 DIAGNOSIS — M542 Cervicalgia: Secondary | ICD-10-CM | POA: Diagnosis not present

## 2022-12-16 DIAGNOSIS — M25561 Pain in right knee: Secondary | ICD-10-CM | POA: Diagnosis not present

## 2022-12-16 DIAGNOSIS — M542 Cervicalgia: Secondary | ICD-10-CM | POA: Diagnosis not present

## 2022-12-16 DIAGNOSIS — M5440 Lumbago with sciatica, unspecified side: Secondary | ICD-10-CM | POA: Diagnosis not present

## 2022-12-21 DIAGNOSIS — M25561 Pain in right knee: Secondary | ICD-10-CM | POA: Diagnosis not present

## 2022-12-21 DIAGNOSIS — M542 Cervicalgia: Secondary | ICD-10-CM | POA: Diagnosis not present

## 2022-12-21 DIAGNOSIS — M5440 Lumbago with sciatica, unspecified side: Secondary | ICD-10-CM | POA: Diagnosis not present

## 2022-12-23 DIAGNOSIS — M25561 Pain in right knee: Secondary | ICD-10-CM | POA: Diagnosis not present

## 2022-12-23 DIAGNOSIS — M5440 Lumbago with sciatica, unspecified side: Secondary | ICD-10-CM | POA: Diagnosis not present

## 2022-12-23 DIAGNOSIS — M542 Cervicalgia: Secondary | ICD-10-CM | POA: Diagnosis not present

## 2022-12-24 DIAGNOSIS — D849 Immunodeficiency, unspecified: Secondary | ICD-10-CM | POA: Diagnosis not present

## 2022-12-24 DIAGNOSIS — Z299 Encounter for prophylactic measures, unspecified: Secondary | ICD-10-CM | POA: Diagnosis not present

## 2022-12-24 DIAGNOSIS — E1159 Type 2 diabetes mellitus with other circulatory complications: Secondary | ICD-10-CM | POA: Diagnosis not present

## 2022-12-24 DIAGNOSIS — I10 Essential (primary) hypertension: Secondary | ICD-10-CM | POA: Diagnosis not present

## 2022-12-24 DIAGNOSIS — I152 Hypertension secondary to endocrine disorders: Secondary | ICD-10-CM | POA: Diagnosis not present

## 2022-12-28 DIAGNOSIS — M25561 Pain in right knee: Secondary | ICD-10-CM | POA: Diagnosis not present

## 2022-12-28 DIAGNOSIS — M5440 Lumbago with sciatica, unspecified side: Secondary | ICD-10-CM | POA: Diagnosis not present

## 2022-12-28 DIAGNOSIS — M542 Cervicalgia: Secondary | ICD-10-CM | POA: Diagnosis not present

## 2022-12-30 DIAGNOSIS — M542 Cervicalgia: Secondary | ICD-10-CM | POA: Diagnosis not present

## 2022-12-30 DIAGNOSIS — M25561 Pain in right knee: Secondary | ICD-10-CM | POA: Diagnosis not present

## 2022-12-30 DIAGNOSIS — M5440 Lumbago with sciatica, unspecified side: Secondary | ICD-10-CM | POA: Diagnosis not present

## 2023-01-04 DIAGNOSIS — M542 Cervicalgia: Secondary | ICD-10-CM | POA: Diagnosis not present

## 2023-01-04 DIAGNOSIS — M5440 Lumbago with sciatica, unspecified side: Secondary | ICD-10-CM | POA: Diagnosis not present

## 2023-01-04 DIAGNOSIS — M25561 Pain in right knee: Secondary | ICD-10-CM | POA: Diagnosis not present

## 2023-01-06 DIAGNOSIS — M5440 Lumbago with sciatica, unspecified side: Secondary | ICD-10-CM | POA: Diagnosis not present

## 2023-01-06 DIAGNOSIS — M542 Cervicalgia: Secondary | ICD-10-CM | POA: Diagnosis not present

## 2023-01-06 DIAGNOSIS — M25561 Pain in right knee: Secondary | ICD-10-CM | POA: Diagnosis not present

## 2023-01-07 DIAGNOSIS — M542 Cervicalgia: Secondary | ICD-10-CM | POA: Diagnosis not present

## 2023-01-07 DIAGNOSIS — M25561 Pain in right knee: Secondary | ICD-10-CM | POA: Diagnosis not present

## 2023-01-11 DIAGNOSIS — M542 Cervicalgia: Secondary | ICD-10-CM | POA: Diagnosis not present

## 2023-01-11 DIAGNOSIS — M5440 Lumbago with sciatica, unspecified side: Secondary | ICD-10-CM | POA: Diagnosis not present

## 2023-01-11 DIAGNOSIS — M25561 Pain in right knee: Secondary | ICD-10-CM | POA: Diagnosis not present

## 2023-02-28 DIAGNOSIS — D849 Immunodeficiency, unspecified: Secondary | ICD-10-CM | POA: Diagnosis not present

## 2023-02-28 DIAGNOSIS — Z940 Kidney transplant status: Secondary | ICD-10-CM | POA: Diagnosis not present

## 2023-02-28 DIAGNOSIS — E1169 Type 2 diabetes mellitus with other specified complication: Secondary | ICD-10-CM | POA: Diagnosis not present

## 2023-02-28 DIAGNOSIS — Z944 Liver transplant status: Secondary | ICD-10-CM | POA: Diagnosis not present

## 2023-03-14 DIAGNOSIS — Z1339 Encounter for screening examination for other mental health and behavioral disorders: Secondary | ICD-10-CM | POA: Diagnosis not present

## 2023-03-14 DIAGNOSIS — Z87891 Personal history of nicotine dependence: Secondary | ICD-10-CM | POA: Diagnosis not present

## 2023-03-14 DIAGNOSIS — Z299 Encounter for prophylactic measures, unspecified: Secondary | ICD-10-CM | POA: Diagnosis not present

## 2023-03-14 DIAGNOSIS — Z1331 Encounter for screening for depression: Secondary | ICD-10-CM | POA: Diagnosis not present

## 2023-03-14 DIAGNOSIS — I10 Essential (primary) hypertension: Secondary | ICD-10-CM | POA: Diagnosis not present

## 2023-03-14 DIAGNOSIS — Z7189 Other specified counseling: Secondary | ICD-10-CM | POA: Diagnosis not present

## 2023-03-14 DIAGNOSIS — Z6832 Body mass index (BMI) 32.0-32.9, adult: Secondary | ICD-10-CM | POA: Diagnosis not present

## 2023-03-14 DIAGNOSIS — Z713 Dietary counseling and surveillance: Secondary | ICD-10-CM | POA: Diagnosis not present

## 2023-03-14 DIAGNOSIS — E6609 Other obesity due to excess calories: Secondary | ICD-10-CM | POA: Diagnosis not present

## 2023-03-14 DIAGNOSIS — Z0000 Encounter for general adult medical examination without abnormal findings: Secondary | ICD-10-CM | POA: Diagnosis not present

## 2023-03-14 DIAGNOSIS — Z1389 Encounter for screening for other disorder: Secondary | ICD-10-CM | POA: Diagnosis not present

## 2023-04-11 DIAGNOSIS — H5203 Hypermetropia, bilateral: Secondary | ICD-10-CM | POA: Diagnosis not present

## 2023-05-16 ENCOUNTER — Other Ambulatory Visit: Payer: Self-pay | Admitting: *Deleted

## 2023-05-16 DIAGNOSIS — I712 Thoracic aortic aneurysm, without rupture, unspecified: Secondary | ICD-10-CM

## 2023-05-16 DIAGNOSIS — Z79899 Other long term (current) drug therapy: Secondary | ICD-10-CM

## 2023-05-16 NOTE — Progress Notes (Signed)
Wife reminded of CT Angio and chest appointment on 05/19/2023 @11  am at [facility]. Also advised to have bmet done prior-and could be done at [facility]. Lab order faxed to [phone]

## 2023-05-17 DIAGNOSIS — Z79899 Other long term (current) drug therapy: Secondary | ICD-10-CM | POA: Diagnosis not present

## 2023-05-17 DIAGNOSIS — I7122 Aneurysm of the aortic arch, without rupture: Secondary | ICD-10-CM | POA: Diagnosis not present

## 2023-05-18 ENCOUNTER — Telehealth: Payer: Self-pay | Admitting: *Deleted

## 2023-05-18 DIAGNOSIS — D490 Neoplasm of unspecified behavior of digestive system: Secondary | ICD-10-CM | POA: Diagnosis not present

## 2023-05-18 DIAGNOSIS — I712 Thoracic aortic aneurysm, without rupture, unspecified: Secondary | ICD-10-CM

## 2023-05-18 DIAGNOSIS — Z944 Liver transplant status: Secondary | ICD-10-CM | POA: Diagnosis not present

## 2023-05-18 DIAGNOSIS — K862 Cyst of pancreas: Secondary | ICD-10-CM | POA: Diagnosis not present

## 2023-05-18 NOTE — Telephone Encounter (Signed)
-----   Message from Vishnu P Mallipeddi sent at 05/18/2023 12:08 PM EDT ----- Serum creatinine is 1.8. Please cancel CTA chest/aorta which is scheduled tomorrow. Obtain 2D Echo for thoracic aortic aneurysm.

## 2023-05-18 NOTE — Telephone Encounter (Signed)
Patient's wife Wille Celeste informed and verbalized understanding of plan. Copy sent to PCP

## 2023-05-19 ENCOUNTER — Ambulatory Visit (HOSPITAL_COMMUNITY): Payer: Medicare HMO

## 2023-05-30 DIAGNOSIS — Z940 Kidney transplant status: Secondary | ICD-10-CM | POA: Diagnosis not present

## 2023-05-30 DIAGNOSIS — B349 Viral infection, unspecified: Secondary | ICD-10-CM | POA: Diagnosis not present

## 2023-05-30 DIAGNOSIS — Z5181 Encounter for therapeutic drug level monitoring: Secondary | ICD-10-CM | POA: Diagnosis not present

## 2023-05-30 DIAGNOSIS — B259 Cytomegaloviral disease, unspecified: Secondary | ICD-10-CM | POA: Diagnosis not present

## 2023-05-31 ENCOUNTER — Other Ambulatory Visit: Payer: Medicare HMO

## 2023-07-12 ENCOUNTER — Telehealth: Payer: Self-pay

## 2023-07-12 ENCOUNTER — Ambulatory Visit: Payer: Medicare HMO | Attending: Internal Medicine

## 2023-07-12 DIAGNOSIS — I712 Thoracic aortic aneurysm, without rupture, unspecified: Secondary | ICD-10-CM | POA: Diagnosis not present

## 2023-07-12 LAB — ECHOCARDIOGRAM COMPLETE
AR max vel: 1.76 cm2
AV Area VTI: 1.97 cm2
AV Area mean vel: 1.85 cm2
AV Mean grad: 15.5 mm[Hg]
AV Peak grad: 27.5 mm[Hg]
Ao pk vel: 2.62 m/s
Area-P 1/2: 2.04 cm2
Calc EF: 65 %
MV VTI: 2.37 cm2
S' Lateral: 3.3 cm
Single Plane A2C EF: 63.1 %
Single Plane A4C EF: 66.3 %

## 2023-07-12 NOTE — Telephone Encounter (Signed)
Patient informed and verbalized understanding of plan. 

## 2023-07-12 NOTE — Telephone Encounter (Signed)
-----   Message from Vishnu P Mallipeddi sent at 07/12/2023  9:11 AM EDT ----- Normal pumping function of the heart, mild aortic valve stenosis (will need surveillance every 3 years) and aortic root/ascending aorta dilatation 4.3 cm.

## 2023-07-15 DIAGNOSIS — Z299 Encounter for prophylactic measures, unspecified: Secondary | ICD-10-CM | POA: Diagnosis not present

## 2023-07-15 DIAGNOSIS — Z125 Encounter for screening for malignant neoplasm of prostate: Secondary | ICD-10-CM | POA: Diagnosis not present

## 2023-07-15 DIAGNOSIS — E7800 Pure hypercholesterolemia, unspecified: Secondary | ICD-10-CM | POA: Diagnosis not present

## 2023-07-15 DIAGNOSIS — Z23 Encounter for immunization: Secondary | ICD-10-CM | POA: Diagnosis not present

## 2023-07-15 DIAGNOSIS — E1159 Type 2 diabetes mellitus with other circulatory complications: Secondary | ICD-10-CM | POA: Diagnosis not present

## 2023-07-15 DIAGNOSIS — I152 Hypertension secondary to endocrine disorders: Secondary | ICD-10-CM | POA: Diagnosis not present

## 2023-07-15 DIAGNOSIS — I10 Essential (primary) hypertension: Secondary | ICD-10-CM | POA: Diagnosis not present

## 2023-07-15 DIAGNOSIS — R5383 Other fatigue: Secondary | ICD-10-CM | POA: Diagnosis not present

## 2023-07-15 DIAGNOSIS — E039 Hypothyroidism, unspecified: Secondary | ICD-10-CM | POA: Diagnosis not present

## 2023-07-15 DIAGNOSIS — Z79899 Other long term (current) drug therapy: Secondary | ICD-10-CM | POA: Diagnosis not present

## 2023-07-15 DIAGNOSIS — Z0000 Encounter for general adult medical examination without abnormal findings: Secondary | ICD-10-CM | POA: Diagnosis not present

## 2023-08-02 ENCOUNTER — Encounter: Payer: Self-pay | Admitting: Internal Medicine

## 2023-08-02 ENCOUNTER — Ambulatory Visit: Payer: Medicare HMO | Attending: Internal Medicine | Admitting: Internal Medicine

## 2023-08-02 VITALS — BP 115/70 | HR 73 | Ht 73.0 in | Wt 236.2 lb

## 2023-08-02 DIAGNOSIS — I77819 Aortic ectasia, unspecified site: Secondary | ICD-10-CM

## 2023-08-02 DIAGNOSIS — I719 Aortic aneurysm of unspecified site, without rupture: Secondary | ICD-10-CM

## 2023-08-02 DIAGNOSIS — I10 Essential (primary) hypertension: Secondary | ICD-10-CM | POA: Diagnosis not present

## 2023-08-02 DIAGNOSIS — Z01810 Encounter for preprocedural cardiovascular examination: Secondary | ICD-10-CM | POA: Diagnosis not present

## 2023-08-02 DIAGNOSIS — E785 Hyperlipidemia, unspecified: Secondary | ICD-10-CM | POA: Insufficient documentation

## 2023-08-02 DIAGNOSIS — E7849 Other hyperlipidemia: Secondary | ICD-10-CM

## 2023-08-02 MED ORDER — ATORVASTATIN CALCIUM 10 MG PO TABS: 10.0000 mg | ORAL_TABLET | Freq: Every day | ORAL | 1 refills | Status: DC

## 2023-08-02 NOTE — Patient Instructions (Addendum)
Medication Instructions:  Your physician recommends that you continue on your current medications as directed. Please refer to the Current Medication list given to you today.   Labwork: None  Testing/Procedures: Your physician has requested that you have an echocardiogramin a year before follow up. Echocardiography is a painless test that uses sound waves to create images of your heart. It provides your doctor with information about the size and shape of your heart and how well your heart's chambers and valves are working. This procedure takes approximately one hour. There are no restrictions for this procedure. Please do NOT wear cologne, perfume, aftershave, or lotions (deodorant is allowed). Please arrive 15 minutes prior to your appointment time.  Please note: We ask at that you not bring children with you during ultrasound (echo/ vascular) testing. Due to room size and safety concerns, children are not allowed in the ultrasound rooms during exams. Our front office staff cannot provide observation of children in our lobby area while testing is being conducted. An adult accompanying a patient to their appointment will only be allowed in the ultrasound room at the discretion of the ultrasound technician under special circumstances. We apologize for any inconvenience.   Follow-Up: Your physician recommends that you schedule a follow-up appointment in: 1 year. You will receive a reminder call in about 8 months reminding you to schedule your appointment. If you don't receive this call, please contact our office.   Any Other Special Instructions Will Be Listed Below (If Applicable).  If you need a refill on your cardiac medications before your next appointment, please call your pharmacy.

## 2023-08-02 NOTE — Addendum Note (Signed)
Addended by: Carmelina Paddock on: 08/02/2023 01:21 PM   Modules accepted: Orders

## 2023-08-02 NOTE — Progress Notes (Signed)
Cardiology Office Note  Date: 08/02/2023   ID: Jeremy Chavez, DOB [birth year], MRN [id number]  PCP:  Ignatius Specking, MD  Cardiologist:  Marjo Bicker, MD Electrophysiologist:  None    History of Present Illness: Jeremy Chavez is a 72 y.o. male known to have HTN, DM 2, liver cirrhosis s/p liver transplantation in 2021, CKD 4 s/p kidney transplantation in 2021, anal fistula s/p lift procedure (ligation of the intersphincteric fistula tract) in 2022 is here for follow-up visit.  Patient was initially referred to cardiology clinic in evaluation of severe coronary calcifications and thoracic aortic aneurysm that was found on CT scan, 46 mm however on echocardiogram it was noted to be 43 mm both for the aortic root and ascending aorta. His LDL levels are in the 60s, currently on atorvastatin 10 mg nightly.  No interval angina.  No symptoms overall, no DOE, orthopnea, PND, leg swelling, dizziness, presyncope and syncope.   Past Medical History:  Diagnosis Date   Diabetes (HCC)    Hypothyroid    NAFLD (nonalcoholic fatty liver disease)     Past Surgical History:  Procedure Laterality Date   carpal tunnel rt hand     COLONOSCOPY N/A 12/06/2019   Procedure: COLONOSCOPY;  Surgeon: Malissa Hippo, MD;  [facility];  Service: Endoscopy;  Laterality: N/A;   ESOPHAGOGASTRODUODENOSCOPY N/A 02/10/2018   Procedure: ESOPHAGOGASTRODUODENOSCOPY (EGD);  Surgeon: Malissa Hippo, MD;  [facility];  Service: Endoscopy;  Laterality: N/A;  2:10   ESOPHAGOGASTRODUODENOSCOPY N/A 05/24/2019   Procedure: ESOPHAGOGASTRODUODENOSCOPY (EGD);  Surgeon: Malissa Hippo, MD;  [facility];  Service: Endoscopy;  Laterality: N/A;  730   ESOPHAGOGASTRODUODENOSCOPY N/A 12/06/2019   Procedure: ESOPHAGOGASTRODUODENOSCOPY (EGD);  Surgeon: Malissa Hippo, MD;  [facility];  Service: Endoscopy;  Laterality: N/A;  7:30   umblical hernia repair      Current  Outpatient Medications  Medication Sig Dispense Refill   acetaminophen (TYLENOL) 500 MG tablet Take 500 mg by mouth as needed.     aspirin EC 81 MG tablet Take 81 mg by mouth every morning. Swallow whole.     atorvastatin (LIPITOR) 10 MG tablet Take 10 mg by mouth daily.     Cholecalciferol (VITAMIN D3) 125 MCG (5000 UT) CAPS Take 1 capsule (5,000 Units total) by mouth daily. (Patient taking differently: Take 1 caplet at 1:00 & 1 caplet at 5:00) 90 capsule 1   glipiZIDE (GLUCOTROL XL) 5 MG 24 hr tablet Take 5 mg by mouth daily with breakfast.     levothyroxine (SYNTHROID) 125 MCG tablet TAKE 1 TABLET BY MOUTH ONCE DAILY BEFORE BREAKFAST 90 tablet 0   levothyroxine (SYNTHROID) 150 MCG tablet Take 150 mcg by mouth daily before breakfast.     Magnesium 400 MG TABS Take by mouth. 2 caps at 1:00 & 2 caps at 5:00     mycophenolate (CELLCEPT) 250 MG capsule Take 250 mg by mouth. 2 tabs every morning & 1 tab at night     nitroGLYCERIN (NITROSTAT) 0.4 MG SL tablet Place 1 tablet (0.4 mg total) under the tongue every 5 (five) minutes x 3 doses as needed for chest pain (if no relief after 3rd dose, proceed to ED or call 911). 25 tablet 2   predniSONE (DELTASONE) 5 MG tablet Take 5 mg by mouth every morning.     tacrolimus (PROGRAF) 0.5 MG capsule Take by mouth. 2 caps every morning & 3 caps at bedtime  lisinopril (ZESTRIL) 20 MG tablet Take 1 tablet (20 mg total) by mouth daily. (Patient not taking: Reported on 08/02/2023) 30 tablet 11   No current facility-administered medications for this visit.   Allergies:  Patient has no known allergies.   Social History: The patient  reports that he quit smoking about 25 years ago. His smoking use included cigarettes. He has never used smokeless tobacco. He reports that he does not drink alcohol and does not use drugs.   Family History: The patient's family history includes Liver cancer in his father.   ROS:  Please see the history of present illness.  Otherwise, complete review of systems is positive for none.  All other systems are reviewed and negative.   Physical Exam: VS:  BP 115/70   Pulse 73   Ht 6\' 1"  (1.854 m)   Wt 236 lb 3.2 oz (107.1 kg)   SpO2 94%   BMI 31.16 kg/m , BMI Body mass index is 31.16 kg/m.  Wt Readings from Last 3 Encounters:  08/02/23 236 lb 3.2 oz (107.1 kg)  07/20/22 238 lb 9.6 oz (108.2 kg)  10/26/19 220 lb (99.8 kg)    General: Patient appears comfortable at rest. HEENT: Conjunctiva and lids normal, oropharynx clear with moist mucosa. Neck: Supple, no elevated JVP or carotid bruits, no thyromegaly. Lungs: Clear to auscultation, nonlabored breathing at rest. Cardiac: Regular rate and rhythm, no S3 or significant systolic murmur, no pericardial rub. Abdomen: Soft, nontender, no hepatomegaly, bowel sounds present, no guarding or rebound. Extremities: No pitting edema, distal pulses 2+. Skin: Warm and dry. Musculoskeletal: No kyphosis. Neuropsychiatric: Alert and oriented x3, affect grossly appropriate.  CT chest without contrast in 8/23 The heart is normal in size with no pericardial effusion. Thoracic aorta is dilated,  measuring up to 4.6 cm in diameter, unchanged from prior. Normal caliber  pulmonary artery. Severe coronary artery calcifications. Severe aortic  valvular calcifications.Dense mitral annular calcifications.   Assessment and Plan:  Imaging evidence of severe coronary calcifications, asymptomatic Thoracic aortic aneurysm (4.6 cm on CT w/o contrast in 2023 and 4.3 cm on Echo in 2024) HLD, at goal S/p liver and kidney transplantation   -Asymptomatic, no interval angina. Currently on atorvastatin 10 mg nightly, okay to continue with immunosuppressants per his nephrologist.  Continue aspirin 81 mg once daily. Will refill atorvastatin 10 mg nightly.  LDL 60s.  Discrepant aortic aneurysm measurements between CT w/o contrast in 2023 (4.6 cm) and echocardiogram in 2024 (4.3 cm aortic root  and 4.3 cm ascending aorta). Will obtain MRA chest/aorta.  I spent a total duration of 30 minutes reviewing prior medications, labs, EKG, notes, face-to-face discussion of his medical condition, pathophysiology, evaluation, management, ordering test and documenting the findings and note.  Refilled atorvastatin.   Medication Adjustments/Labs and Tests Ordered: Current medicines are reviewed at length with the patient today.  Concerns regarding medicines are outlined above.    Disposition:  Follow up  one year  Signed Ernesteen Mihalic Verne Spurr, MD, 08/02/2023 10:42 AM    [facility] at [facility] [address] [place], [place], [place] [address]

## 2023-08-16 DIAGNOSIS — I10 Essential (primary) hypertension: Secondary | ICD-10-CM | POA: Diagnosis not present

## 2023-08-16 DIAGNOSIS — R350 Frequency of micturition: Secondary | ICD-10-CM | POA: Diagnosis not present

## 2023-08-16 DIAGNOSIS — N400 Enlarged prostate without lower urinary tract symptoms: Secondary | ICD-10-CM | POA: Diagnosis not present

## 2023-08-16 DIAGNOSIS — D849 Immunodeficiency, unspecified: Secondary | ICD-10-CM | POA: Diagnosis not present

## 2023-08-16 DIAGNOSIS — N390 Urinary tract infection, site not specified: Secondary | ICD-10-CM | POA: Diagnosis not present

## 2023-08-16 DIAGNOSIS — Z299 Encounter for prophylactic measures, unspecified: Secondary | ICD-10-CM | POA: Diagnosis not present

## 2023-08-30 DIAGNOSIS — N1832 Chronic kidney disease, stage 3b: Secondary | ICD-10-CM | POA: Diagnosis not present

## 2023-08-30 DIAGNOSIS — B182 Chronic viral hepatitis C: Secondary | ICD-10-CM | POA: Diagnosis not present

## 2023-08-30 DIAGNOSIS — I10 Essential (primary) hypertension: Secondary | ICD-10-CM | POA: Diagnosis not present

## 2023-08-30 DIAGNOSIS — Z299 Encounter for prophylactic measures, unspecified: Secondary | ICD-10-CM | POA: Diagnosis not present

## 2023-08-30 DIAGNOSIS — E039 Hypothyroidism, unspecified: Secondary | ICD-10-CM | POA: Diagnosis not present

## 2023-08-30 DIAGNOSIS — D692 Other nonthrombocytopenic purpura: Secondary | ICD-10-CM | POA: Diagnosis not present

## 2023-09-01 DIAGNOSIS — L821 Other seborrheic keratosis: Secondary | ICD-10-CM | POA: Diagnosis not present

## 2023-09-01 DIAGNOSIS — D225 Melanocytic nevi of trunk: Secondary | ICD-10-CM | POA: Diagnosis not present

## 2023-09-01 DIAGNOSIS — C44619 Basal cell carcinoma of skin of left upper limb, including shoulder: Secondary | ICD-10-CM | POA: Diagnosis not present

## 2023-09-01 DIAGNOSIS — L820 Inflamed seborrheic keratosis: Secondary | ICD-10-CM | POA: Diagnosis not present

## 2023-09-05 DIAGNOSIS — Z23 Encounter for immunization: Secondary | ICD-10-CM | POA: Diagnosis not present

## 2023-09-05 DIAGNOSIS — Z944 Liver transplant status: Secondary | ICD-10-CM | POA: Diagnosis not present

## 2023-09-05 DIAGNOSIS — D84821 Immunodeficiency due to drugs: Secondary | ICD-10-CM | POA: Diagnosis not present

## 2023-09-05 DIAGNOSIS — Z940 Kidney transplant status: Secondary | ICD-10-CM | POA: Diagnosis not present

## 2023-09-05 DIAGNOSIS — Z87891 Personal history of nicotine dependence: Secondary | ICD-10-CM | POA: Diagnosis not present

## 2023-09-05 DIAGNOSIS — Z79899 Other long term (current) drug therapy: Secondary | ICD-10-CM | POA: Diagnosis not present

## 2023-09-05 DIAGNOSIS — Z48288 Encounter for aftercare following multiple organ transplant: Secondary | ICD-10-CM | POA: Diagnosis not present

## 2023-09-05 DIAGNOSIS — D849 Immunodeficiency, unspecified: Secondary | ICD-10-CM | POA: Diagnosis not present

## 2023-10-06 DIAGNOSIS — Z940 Kidney transplant status: Secondary | ICD-10-CM | POA: Diagnosis not present

## 2023-10-06 DIAGNOSIS — Z944 Liver transplant status: Secondary | ICD-10-CM | POA: Diagnosis not present

## 2023-11-15 DIAGNOSIS — N1832 Chronic kidney disease, stage 3b: Secondary | ICD-10-CM | POA: Diagnosis not present

## 2023-11-15 DIAGNOSIS — E1159 Type 2 diabetes mellitus with other circulatory complications: Secondary | ICD-10-CM | POA: Diagnosis not present

## 2023-11-15 DIAGNOSIS — B182 Chronic viral hepatitis C: Secondary | ICD-10-CM | POA: Diagnosis not present

## 2023-11-15 DIAGNOSIS — E1165 Type 2 diabetes mellitus with hyperglycemia: Secondary | ICD-10-CM | POA: Diagnosis not present

## 2023-11-15 DIAGNOSIS — Z299 Encounter for prophylactic measures, unspecified: Secondary | ICD-10-CM | POA: Diagnosis not present

## 2023-11-15 DIAGNOSIS — I10 Essential (primary) hypertension: Secondary | ICD-10-CM | POA: Diagnosis not present

## 2023-12-23 ENCOUNTER — Ambulatory Visit (HOSPITAL_COMMUNITY)
Admission: RE | Admit: 2023-12-23 | Discharge: 2023-12-23 | Disposition: A | Discharge status: Home / Self Care | Payer: Medicare HMO | Source: Ambulatory Visit | Attending: Internal Medicine | Admitting: Internal Medicine

## 2023-12-23 DIAGNOSIS — I719 Aortic aneurysm of unspecified site, without rupture: Secondary | ICD-10-CM | POA: Insufficient documentation

## 2023-12-23 DIAGNOSIS — I77819 Aortic ectasia, unspecified site: Secondary | ICD-10-CM | POA: Insufficient documentation

## 2023-12-23 MED ORDER — GADOBUTROL 1 MMOL/ML IV SOLN
10.0000 mL | Freq: Once | INTRAVENOUS | Status: AC | PRN
  Administered 2023-12-23: 10 mL via INTRAVENOUS

## 2024-01-06 DIAGNOSIS — Z944 Liver transplant status: Secondary | ICD-10-CM | POA: Diagnosis not present

## 2024-01-06 DIAGNOSIS — Z79899 Other long term (current) drug therapy: Secondary | ICD-10-CM | POA: Diagnosis not present

## 2024-01-06 DIAGNOSIS — Z940 Kidney transplant status: Secondary | ICD-10-CM | POA: Diagnosis not present

## 2024-01-17 ENCOUNTER — Other Ambulatory Visit: Payer: Self-pay | Admitting: Internal Medicine

## 2024-01-23 DIAGNOSIS — Z940 Kidney transplant status: Secondary | ICD-10-CM | POA: Diagnosis not present

## 2024-01-23 DIAGNOSIS — Z5181 Encounter for therapeutic drug level monitoring: Secondary | ICD-10-CM | POA: Diagnosis not present

## 2024-03-01 DIAGNOSIS — N400 Enlarged prostate without lower urinary tract symptoms: Secondary | ICD-10-CM | POA: Diagnosis not present

## 2024-03-01 DIAGNOSIS — I10 Essential (primary) hypertension: Secondary | ICD-10-CM | POA: Diagnosis not present

## 2024-03-01 DIAGNOSIS — Z299 Encounter for prophylactic measures, unspecified: Secondary | ICD-10-CM | POA: Diagnosis not present

## 2024-03-01 DIAGNOSIS — E1165 Type 2 diabetes mellitus with hyperglycemia: Secondary | ICD-10-CM | POA: Diagnosis not present

## 2024-03-01 DIAGNOSIS — Z944 Liver transplant status: Secondary | ICD-10-CM | POA: Diagnosis not present

## 2024-03-16 DIAGNOSIS — Z79624 Long term (current) use of inhibitors of nucleotide synthesis: Secondary | ICD-10-CM | POA: Diagnosis not present

## 2024-03-16 DIAGNOSIS — I10 Essential (primary) hypertension: Secondary | ICD-10-CM | POA: Diagnosis not present

## 2024-03-16 DIAGNOSIS — D84821 Immunodeficiency due to drugs: Secondary | ICD-10-CM | POA: Diagnosis not present

## 2024-03-16 DIAGNOSIS — E213 Hyperparathyroidism, unspecified: Secondary | ICD-10-CM | POA: Diagnosis not present

## 2024-03-16 DIAGNOSIS — Z7984 Long term (current) use of oral hypoglycemic drugs: Secondary | ICD-10-CM | POA: Diagnosis not present

## 2024-03-16 DIAGNOSIS — Z4823 Encounter for aftercare following liver transplant: Secondary | ICD-10-CM | POA: Diagnosis not present

## 2024-03-16 DIAGNOSIS — Z23 Encounter for immunization: Secondary | ICD-10-CM | POA: Diagnosis not present

## 2024-03-16 DIAGNOSIS — D849 Immunodeficiency, unspecified: Secondary | ICD-10-CM | POA: Diagnosis not present

## 2024-03-16 DIAGNOSIS — E7800 Pure hypercholesterolemia, unspecified: Secondary | ICD-10-CM | POA: Diagnosis not present

## 2024-03-16 DIAGNOSIS — Z940 Kidney transplant status: Secondary | ICD-10-CM | POA: Diagnosis not present

## 2024-03-16 DIAGNOSIS — Z944 Liver transplant status: Secondary | ICD-10-CM | POA: Diagnosis not present

## 2024-03-28 DIAGNOSIS — R7989 Other specified abnormal findings of blood chemistry: Secondary | ICD-10-CM | POA: Diagnosis not present

## 2024-03-28 DIAGNOSIS — Z944 Liver transplant status: Secondary | ICD-10-CM | POA: Diagnosis not present

## 2024-03-28 DIAGNOSIS — D84821 Immunodeficiency due to drugs: Secondary | ICD-10-CM | POA: Diagnosis not present

## 2024-04-05 DIAGNOSIS — R1110 Vomiting, unspecified: Secondary | ICD-10-CM | POA: Diagnosis not present

## 2024-04-05 DIAGNOSIS — R61 Generalized hyperhidrosis: Secondary | ICD-10-CM | POA: Diagnosis not present

## 2024-04-05 DIAGNOSIS — K5730 Diverticulosis of large intestine without perforation or abscess without bleeding: Secondary | ICD-10-CM | POA: Diagnosis not present

## 2024-04-05 DIAGNOSIS — R197 Diarrhea, unspecified: Secondary | ICD-10-CM | POA: Diagnosis not present

## 2024-04-05 DIAGNOSIS — R001 Bradycardia, unspecified: Secondary | ICD-10-CM | POA: Diagnosis not present

## 2024-04-05 DIAGNOSIS — K8309 Other cholangitis: Secondary | ICD-10-CM | POA: Diagnosis not present

## 2024-04-05 DIAGNOSIS — Z944 Liver transplant status: Secondary | ICD-10-CM | POA: Diagnosis not present

## 2024-04-05 DIAGNOSIS — N2889 Other specified disorders of kidney and ureter: Secondary | ICD-10-CM | POA: Diagnosis not present

## 2024-04-05 DIAGNOSIS — K838 Other specified diseases of biliary tract: Secondary | ICD-10-CM | POA: Diagnosis not present

## 2024-04-05 DIAGNOSIS — R161 Splenomegaly, not elsewhere classified: Secondary | ICD-10-CM | POA: Diagnosis not present

## 2024-04-05 DIAGNOSIS — R112 Nausea with vomiting, unspecified: Secondary | ICD-10-CM | POA: Diagnosis not present

## 2024-04-05 DIAGNOSIS — R0689 Other abnormalities of breathing: Secondary | ICD-10-CM | POA: Diagnosis not present

## 2024-04-06 DIAGNOSIS — E119 Type 2 diabetes mellitus without complications: Secondary | ICD-10-CM | POA: Diagnosis not present

## 2024-04-06 DIAGNOSIS — K8031 Calculus of bile duct with cholangitis, unspecified, with obstruction: Secondary | ICD-10-CM | POA: Diagnosis not present

## 2024-04-06 DIAGNOSIS — I10 Essential (primary) hypertension: Secondary | ICD-10-CM | POA: Diagnosis not present

## 2024-04-06 DIAGNOSIS — D84821 Immunodeficiency due to drugs: Secondary | ICD-10-CM | POA: Diagnosis not present

## 2024-04-06 DIAGNOSIS — Z79621 Long term (current) use of calcineurin inhibitor: Secondary | ICD-10-CM | POA: Diagnosis not present

## 2024-04-06 DIAGNOSIS — R197 Diarrhea, unspecified: Secondary | ICD-10-CM | POA: Diagnosis not present

## 2024-04-06 DIAGNOSIS — Z79624 Long term (current) use of inhibitors of nucleotide synthesis: Secondary | ICD-10-CM | POA: Diagnosis not present

## 2024-04-06 DIAGNOSIS — K8309 Other cholangitis: Secondary | ICD-10-CM | POA: Diagnosis not present

## 2024-04-06 DIAGNOSIS — K8689 Other specified diseases of pancreas: Secondary | ICD-10-CM | POA: Diagnosis not present

## 2024-04-06 DIAGNOSIS — Z792 Long term (current) use of antibiotics: Secondary | ICD-10-CM | POA: Diagnosis not present

## 2024-04-06 DIAGNOSIS — Z944 Liver transplant status: Secondary | ICD-10-CM | POA: Diagnosis not present

## 2024-04-06 DIAGNOSIS — I864 Gastric varices: Secondary | ICD-10-CM | POA: Diagnosis not present

## 2024-04-06 DIAGNOSIS — D849 Immunodeficiency, unspecified: Secondary | ICD-10-CM | POA: Diagnosis not present

## 2024-04-06 DIAGNOSIS — E039 Hypothyroidism, unspecified: Secondary | ICD-10-CM | POA: Diagnosis not present

## 2024-04-06 DIAGNOSIS — Z940 Kidney transplant status: Secondary | ICD-10-CM | POA: Diagnosis not present

## 2024-04-06 DIAGNOSIS — K7460 Unspecified cirrhosis of liver: Secondary | ICD-10-CM | POA: Diagnosis not present

## 2024-04-06 DIAGNOSIS — K838 Other specified diseases of biliary tract: Secondary | ICD-10-CM | POA: Diagnosis not present

## 2024-04-06 DIAGNOSIS — R112 Nausea with vomiting, unspecified: Secondary | ICD-10-CM | POA: Diagnosis not present

## 2024-04-10 DIAGNOSIS — Z9483 Pancreas transplant status: Secondary | ICD-10-CM | POA: Diagnosis not present

## 2024-04-10 DIAGNOSIS — Z940 Kidney transplant status: Secondary | ICD-10-CM | POA: Diagnosis not present

## 2024-04-10 DIAGNOSIS — Z5181 Encounter for therapeutic drug level monitoring: Secondary | ICD-10-CM | POA: Diagnosis not present

## 2024-04-13 DIAGNOSIS — E6609 Other obesity due to excess calories: Secondary | ICD-10-CM | POA: Diagnosis not present

## 2024-04-13 DIAGNOSIS — Z299 Encounter for prophylactic measures, unspecified: Secondary | ICD-10-CM | POA: Diagnosis not present

## 2024-04-13 DIAGNOSIS — Z1339 Encounter for screening examination for other mental health and behavioral disorders: Secondary | ICD-10-CM | POA: Diagnosis not present

## 2024-04-13 DIAGNOSIS — Z6830 Body mass index (BMI) 30.0-30.9, adult: Secondary | ICD-10-CM | POA: Diagnosis not present

## 2024-04-13 DIAGNOSIS — Z1331 Encounter for screening for depression: Secondary | ICD-10-CM | POA: Diagnosis not present

## 2024-04-13 DIAGNOSIS — I10 Essential (primary) hypertension: Secondary | ICD-10-CM | POA: Diagnosis not present

## 2024-04-13 DIAGNOSIS — Z0000 Encounter for general adult medical examination without abnormal findings: Secondary | ICD-10-CM | POA: Diagnosis not present

## 2024-04-13 DIAGNOSIS — Z7189 Other specified counseling: Secondary | ICD-10-CM | POA: Diagnosis not present

## 2024-04-13 DIAGNOSIS — B182 Chronic viral hepatitis C: Secondary | ICD-10-CM | POA: Diagnosis not present

## 2024-04-13 DIAGNOSIS — Z713 Dietary counseling and surveillance: Secondary | ICD-10-CM | POA: Diagnosis not present

## 2024-04-16 DIAGNOSIS — E119 Type 2 diabetes mellitus without complications: Secondary | ICD-10-CM | POA: Diagnosis not present

## 2024-04-17 DIAGNOSIS — B998 Other infectious disease: Secondary | ICD-10-CM | POA: Diagnosis not present

## 2024-04-17 DIAGNOSIS — Z944 Liver transplant status: Secondary | ICD-10-CM | POA: Diagnosis not present

## 2024-04-17 DIAGNOSIS — D849 Immunodeficiency, unspecified: Secondary | ICD-10-CM | POA: Diagnosis not present

## 2024-05-15 DIAGNOSIS — Z944 Liver transplant status: Secondary | ICD-10-CM | POA: Diagnosis not present

## 2024-06-26 DIAGNOSIS — E119 Type 2 diabetes mellitus without complications: Secondary | ICD-10-CM | POA: Diagnosis not present

## 2024-06-26 DIAGNOSIS — N1832 Chronic kidney disease, stage 3b: Secondary | ICD-10-CM | POA: Diagnosis not present

## 2024-06-26 DIAGNOSIS — I10 Essential (primary) hypertension: Secondary | ICD-10-CM | POA: Diagnosis not present

## 2024-06-26 DIAGNOSIS — Z299 Encounter for prophylactic measures, unspecified: Secondary | ICD-10-CM | POA: Diagnosis not present

## 2024-06-26 DIAGNOSIS — Z23 Encounter for immunization: Secondary | ICD-10-CM | POA: Diagnosis not present

## 2024-07-16 DIAGNOSIS — Z944 Liver transplant status: Secondary | ICD-10-CM | POA: Diagnosis not present

## 2024-07-24 ENCOUNTER — Ambulatory Visit: Payer: Medicare HMO | Attending: Internal Medicine

## 2024-07-24 DIAGNOSIS — Z0000 Encounter for general adult medical examination without abnormal findings: Secondary | ICD-10-CM | POA: Diagnosis not present

## 2024-07-24 DIAGNOSIS — Z125 Encounter for screening for malignant neoplasm of prostate: Secondary | ICD-10-CM | POA: Diagnosis not present

## 2024-07-24 DIAGNOSIS — Z299 Encounter for prophylactic measures, unspecified: Secondary | ICD-10-CM | POA: Diagnosis not present

## 2024-07-24 DIAGNOSIS — Z79899 Other long term (current) drug therapy: Secondary | ICD-10-CM | POA: Diagnosis not present

## 2024-07-24 DIAGNOSIS — E7800 Pure hypercholesterolemia, unspecified: Secondary | ICD-10-CM | POA: Diagnosis not present

## 2024-07-24 DIAGNOSIS — I10 Essential (primary) hypertension: Secondary | ICD-10-CM | POA: Diagnosis not present

## 2024-07-24 DIAGNOSIS — R5383 Other fatigue: Secondary | ICD-10-CM | POA: Diagnosis not present

## 2024-07-30 ENCOUNTER — Telehealth: Payer: Self-pay | Admitting: Internal Medicine

## 2024-07-30 DIAGNOSIS — I77819 Aortic ectasia, unspecified site: Secondary | ICD-10-CM

## 2024-07-30 NOTE — Telephone Encounter (Signed)
 Wife Danna) wants a call back to confirm if patient still needs to get echocardiogram as order has expired.

## 2024-07-30 NOTE — Telephone Encounter (Signed)
 Echo reordered. Pts wife notified and verbalized understanding.

## 2024-08-02 ENCOUNTER — Encounter: Payer: Self-pay | Admitting: Internal Medicine

## 2024-09-11 ENCOUNTER — Ambulatory Visit: Attending: Internal Medicine

## 2024-09-11 DIAGNOSIS — I77819 Aortic ectasia, unspecified site: Secondary | ICD-10-CM

## 2024-09-11 DIAGNOSIS — I340 Nonrheumatic mitral (valve) insufficiency: Secondary | ICD-10-CM

## 2024-09-17 LAB — ECHOCARDIOGRAM COMPLETE
AR max vel: 1.81 cm2
AV Area VTI: 1.98 cm2
AV Area mean vel: 2 cm2
AV Mean grad: 16 mmHg
AV Peak grad: 28.9 mmHg
Ao pk vel: 2.69 m/s
Area-P 1/2: 2.58 cm2
Calc EF: 68.3 %
MV VTI: 2.44 cm2
S' Lateral: 3 cm
Single Plane A2C EF: 65.9 %
Single Plane A4C EF: 67.6 %

## 2024-09-18 ENCOUNTER — Ambulatory Visit: Payer: Self-pay | Admitting: Internal Medicine

## 2024-09-18 NOTE — Telephone Encounter (Signed)
-----   Message from Vishnu Mallipeddi, MD sent at 09/18/2024  3:37 PM EST ----- Normal LV function, G1 DD with elevated LVEDP, normal RV function, mild to moderate MS, mild AAS, aorta dilatation (43 mm aortic root and 43 mm ascending aorta), CVP 15 mmHg.  Stable changes compared  to prior echocardiogram.  Volume overloaded based on echocardiogram findings. If he reports DOE then start p.o. Lasix  20 mg once daily.

## 2024-09-18 NOTE — Telephone Encounter (Signed)
 The patient has been notified of the result and verbalized understanding.  All questions (if any) were answered. Wife stated patient did not get over exert himself at this time and there was no need to send in script for Lasix . Advised of upcoming appointment so that echo results could be explained in further detailed  Littie CHRISTELLA Croak, CMA 09/18/2024 4:40 PM

## 2024-09-26 ENCOUNTER — Ambulatory Visit: Admitting: Internal Medicine

## 2024-10-17 ENCOUNTER — Ambulatory Visit: Admitting: Internal Medicine

## 2024-10-18 ENCOUNTER — Ambulatory Visit: Admitting: Internal Medicine

## 2024-10-18 ENCOUNTER — Encounter: Payer: Self-pay | Admitting: Internal Medicine

## 2024-10-18 VITALS — BP 124/70 | HR 84 | Ht 73.0 in | Wt 229.6 lb

## 2024-10-18 DIAGNOSIS — I050 Rheumatic mitral stenosis: Secondary | ICD-10-CM

## 2024-10-18 DIAGNOSIS — I77819 Aortic ectasia, unspecified site: Secondary | ICD-10-CM

## 2024-10-18 DIAGNOSIS — I350 Nonrheumatic aortic (valve) stenosis: Secondary | ICD-10-CM | POA: Diagnosis not present

## 2024-10-18 DIAGNOSIS — I509 Heart failure, unspecified: Secondary | ICD-10-CM

## 2024-10-18 NOTE — Progress Notes (Signed)
 "    Cardiology Office Note  Date: 10/18/2024   ID: Jeremy Chavez, DOB 20-Oct-1950, MRN [id number]  PCP:  Rosamond Leta NOVAK, MD  Cardiologist:  Diannah SHAUNNA Maywood, MD Electrophysiologist:  None    History of Present Illness: Jeremy Chavez is a 74 y.o. male known to have HTN, DM 2, liver cirrhosis s/p liver transplantation in 2021, CKD 4 s/p kidney transplantation in 2021, anal fistula s/p lift procedure (ligation of the intersphincteric fistula tract) in 2022 is here for follow-up visit.  Patient was initially referred to cardiology clinic in evaluation of severe coronary calcifications and thoracic aortic aneurysm that was found on CT scan, 46 mm however on echocardiogram it was noted to be 43 mm both for the aortic root and ascending aorta.  Lipid panel reviewed, LDL 52, TG 85 from June 2025, within normal limits.  He underwent echocardiogram in December 2025 for aortic dilatation that showed normal LV function, G1 DD with elevated LVEDP, normal RV function, mild to moderate MS, mild AAS, aorta dilatation (43 mm aortic root and 43 mm ascending aorta) and CVP 15 mmHg.  Due to volume overload based on echocardiogram, he was recommended to start p.o. Lasix  20 mg once daily.  He then wanted clearance from his nephrologist to start p.o. Lasix .  He was evaluated by his nephrologist in January 2026 and cleared to take p.o. Lasix  20 mg twice daily.  Upon speaking with the patient today, he reported DOE since fall 2025.  After taking Lasix  around 2 weeks ago, he reported improvement in his symptoms.  No leg swelling.  No angina.  No other symptoms.  Labs checked, BMP stable.   Past Medical History:  Diagnosis Date   Diabetes (HCC)    Hypothyroid    NAFLD (nonalcoholic fatty liver disease)     Past Surgical History:  Procedure Laterality Date   carpal tunnel rt hand     COLONOSCOPY N/A 12/06/2019   Procedure: COLONOSCOPY;  Surgeon: Golda Claudis PENNER, MD;  [facility];  Service:  Endoscopy;  Laterality: N/A;   ESOPHAGOGASTRODUODENOSCOPY N/A 02/10/2018   Procedure: ESOPHAGOGASTRODUODENOSCOPY (EGD);  Surgeon: Golda Claudis PENNER, MD;  [facility];  Service: Endoscopy;  Laterality: N/A;  2:10   ESOPHAGOGASTRODUODENOSCOPY N/A 05/24/2019   Procedure: ESOPHAGOGASTRODUODENOSCOPY (EGD);  Surgeon: Golda Claudis PENNER, MD;  [facility];  Service: Endoscopy;  Laterality: N/A;  730   ESOPHAGOGASTRODUODENOSCOPY N/A 12/06/2019   Procedure: ESOPHAGOGASTRODUODENOSCOPY (EGD);  Surgeon: Golda Claudis PENNER, MD;  [facility];  Service: Endoscopy;  Laterality: N/A;  7:30   umblical hernia repair      Current Outpatient Medications  Medication Sig Dispense Refill   acetaminophen  (TYLENOL ) 500 MG tablet Take 500 mg by mouth as needed.     amLODipine (NORVASC) 5 MG tablet Take 5 mg by mouth daily.     aspirin EC 81 MG tablet Take 81 mg by mouth every morning. Swallow whole.     atorvastatin  (LIPITOR) 10 MG tablet TAKE 1 TABLET BY MOUTH AT BEDTIME 90 tablet 3   Cholecalciferol (VITAMIN D3) 125 MCG (5000 UT) CAPS Take 1 capsule (5,000 Units total) by mouth daily. (Patient taking differently: Take 1 caplet at 1:00 & 1 caplet at 5:00) 90 capsule 1   furosemide  (LASIX ) 20 MG tablet Take 20 mg by mouth 2 (two) times daily.     glipiZIDE (GLUCOTROL) 5 MG tablet Take 5 mg by mouth daily.     levothyroxine  (SYNTHROID ) 125  MCG tablet TAKE 1 TABLET BY MOUTH ONCE DAILY BEFORE BREAKFAST 90 tablet 0   Magnesium 400 MG TABS Take by mouth. 2 caps at 1:00 & 2 caps at 5:00     mycophenolate  (CELLCEPT ) 250 MG capsule Take 250 mg by mouth. 2 tabs every morning & 1 tab at night     nitroGLYCERIN  (NITROSTAT ) 0.4 MG SL tablet Place 1 tablet (0.4 mg total) under the tongue every 5 (five) minutes x 3 doses as needed for chest pain (if no relief after 3rd dose, proceed to ED or call 911). 25 tablet 2   predniSONE  (DELTASONE ) 5 MG tablet Take 5 mg by mouth every morning.     tacrolimus   (PROGRAF ) 0.5 MG capsule Take by mouth. 2 caps every morning & 3 caps at bedtime     No current facility-administered medications for this visit.   Allergies:  Patient has no known allergies.   Social History: The patient  reports that he quit smoking about 27 years ago. His smoking use included cigarettes. He smoked an average of 1.5 packs per day. He has never used smokeless tobacco. He reports that he does not drink alcohol  and does not use drugs.   Family History: The patient's family history includes Liver cancer in his father.   ROS:  Please see the history of present illness. Otherwise, complete review of systems is positive for none.  All other systems are reviewed and negative.   Physical Exam: VS:  BP 124/70 (BP Location: Right Arm, Patient Position: Sitting, Cuff Size: Large)   Pulse 84   Ht 6' 1 (1.854 m)   Wt 229 lb 9.6 oz (104.1 kg)   SpO2 97%   BMI 30.29 kg/m , BMI Body mass index is 30.29 kg/m.  Wt Readings from Last 3 Encounters:  10/18/24 229 lb 9.6 oz (104.1 kg)  08/02/23 236 lb 3.2 oz (107.1 kg)  07/20/22 238 lb 9.6 oz (108.2 kg)    General: Patient appears comfortable at rest. HEENT: Conjunctiva and lids normal, oropharynx clear with moist mucosa. Neck: Supple, no elevated JVP or carotid bruits, no thyromegaly. Lungs: Clear to auscultation, nonlabored breathing at rest. Cardiac: Regular rate and rhythm, no S3 or significant systolic murmur, no pericardial rub. Abdomen: Soft, nontender, no hepatomegaly, bowel sounds present, no guarding or rebound. Extremities: No pitting edema, distal pulses 2+. Skin: Warm and dry. Musculoskeletal: No kyphosis. Neuropsychiatric: Alert and oriented x3, affect grossly appropriate.  CT chest without contrast in 8/23 The heart is normal in size with no pericardial effusion. Thoracic aorta is dilated,  measuring up to 4.6 cm in diameter, unchanged from prior. Normal caliber  pulmonary artery. Severe coronary artery  calcifications. Severe aortic  valvular calcifications.Dense mitral annular calcifications.   Assessment and Plan:  Acute diastolic heart failure - Presented with DOE since fall 2025.  Echo from 09/18/2024 showed normal LV function, normal RV function, G1 DD with elevated LVEDP, mild to moderate MS, mild AAS, aorta dilatation and CVP 15 mmHg. - Continue p.o. Lasix  20 mg twice daily.  He reported significant improvement in his symptoms after starting Lasix .  He received clearance from his nephrologist to take Lasix . - Due to severe coronary calcifications and now new onset of ADHF, he will need ischemia evaluation.  Obtain cardiac stress PET scan.  He has a history of liver and kidney transplantation, now with serum creatinine 1.4, has CKD.  Mild to moderate MS Mild AS In 2025 - Obtain yearly echocardiograms.  Thoracic aortic aneurysm -  4.6 cm on CT without contrast in 2023.  4.3 cm on echo in 2024 and 2025.  Will follow-up on yearly echocardiograms.  HLD, at goal - Continue atorvastatin  10 mg nightly.  LDL 52 from June 2025.  Goal LD less than 70.  I spent 10 minutes reviewing prior records, more than 3 labs.  20 minutes spent with the patient.  10-minute spent in documentation.   Medication Adjustments/Labs and Tests Ordered: Current medicines are reviewed at length with the patient today.  Concerns regarding medicines are outlined above.    Disposition:  Follow up 6 months  Signed Faizon Capozzi Priya Brittian Renaldo, MD, 10/18/2024 9:23 AM    [facility] at [facility] [address] [place], [place], [place] [address]  "

## 2024-10-18 NOTE — Patient Instructions (Addendum)
 Medication Instructions:  Your physician recommends that you continue on your current medications as directed. Please refer to the Current Medication list given to you today.   Labwork: None  Testing/Procedures:    Please report to Radiology at the [facility] Main Entrance 30 minutes early for your test.  [address] [place], [place] [address]                         OR   Please report to Radiology at [facility] Main Entrance, medical mall, 30 mins prior to your test.  [address]  [place], [place]  How to Prepare for Your Cardiac PET/CT Stress Test:  Nothing to eat or drink, except water , 3 hours prior to arrival time.  NO caffeine/decaffeinated products, or chocolate 12 hours prior to arrival. (Please note decaffeinated beverages (teas/coffees) still contain caffeine).  If you have caffeine within 12 hours prior, the test will need to be rescheduled.  Medication instructions: Do not take erectile dysfunction medications for 72 hours prior to test (sildenafil, tadalafil) Do not take nitrates (isosorbide mononitrate, Ranexa) the day before or day of test Do not take tamsulosin the day before or morning of test Hold theophylline containing medications for 12 hours. Hold Dipyridamole 48 hours prior to the test.  Diabetic Preparation: If able to eat breakfast prior to 3 hour fasting, you may take all medications, including your insulin . Do not worry if you miss your breakfast dose of insulin  - start at your next meal. If you do not eat prior to 3 hour fast-Hold all diabetes (oral and insulin ) medications. Patients who wear a continuous glucose monitor MUST remove the device prior to scanning.  You may take your remaining medications with water .  NO perfume, cologne or lotion on chest or abdomen area. FEMALES - Please avoid wearing dresses to this appointment.  Total time is 1 to 2 hours; you may want to bring reading material for  the waiting time.  IF YOU THINK YOU MAY BE PREGNANT, OR ARE NURSING PLEASE INFORM THE TECHNOLOGIST.  In preparation for your appointment, medication and supplies will be purchased.  Appointment availability is limited, so if you need to cancel or reschedule, please call the Radiology Department Scheduler at [phone] 24 hours in advance to avoid a cancellation fee of $100.00  What to Expect When you Arrive:  Once you arrive and check in for your appointment, you will be taken to a preparation room within the Radiology Department.  A technologist or Nurse will obtain your medical history, verify that you are correctly prepped for the exam, and explain the procedure.  Afterwards, an IV will be started in your arm and electrodes will be placed on your skin for EKG monitoring during the stress portion of the exam. Then you will be escorted to the PET/CT scanner.  There, staff will get you positioned on the scanner and obtain a blood pressure and EKG.  During the exam, you will continue to be connected to the EKG and blood pressure machines.  A small, safe amount of a radioactive tracer will be injected in your IV to obtain a series of pictures of your heart along with an injection of a stress agent.    After your Exam:  It is recommended that you eat a meal and drink a caffeinated beverage to counter act any effects of the stress agent.  Drink plenty of fluids for the remainder of  the day and urinate frequently for the first couple of hours after the exam.  Your doctor will inform you of your test results within 7-10 business days.  For more information and frequently asked questions, please visit our website: [email or web address]  For questions about your test or how to prepare for your test, please call: Cardiac Imaging Nurse Navigators Office: [phone]  For billing questions, please call [phone].    Follow-Up: Your physician recommends that you schedule a follow-up  appointment in: 6 months  Any Other Special Instructions Will Be Listed Below (If Applicable). Farxiga or Jardiance   Thank you for choosing [facility]!     If you need a refill on your cardiac medications before your next appointment, please call your pharmacy.

## 2024-11-09 ENCOUNTER — Ambulatory Visit: Admitting: Nurse Practitioner

## 2024-11-21 ENCOUNTER — Other Ambulatory Visit (HOSPITAL_COMMUNITY)
